# Patient Record
Sex: Female | Born: 1955 | Race: White | Hispanic: No | Marital: Married | Smoking: Never smoker
Health system: Southern US, Community
[De-identification: ages and names within clinical notes are randomized; demographics above are authoritative.]

## PROBLEM LIST (undated history)

## (undated) DIAGNOSIS — K219 Gastro-esophageal reflux disease without esophagitis: Secondary | ICD-10-CM

## (undated) DIAGNOSIS — J45909 Unspecified asthma, uncomplicated: Secondary | ICD-10-CM

## (undated) DIAGNOSIS — G2581 Restless legs syndrome: Secondary | ICD-10-CM

## (undated) DIAGNOSIS — E785 Hyperlipidemia, unspecified: Secondary | ICD-10-CM

## (undated) DIAGNOSIS — C419 Malignant neoplasm of bone and articular cartilage, unspecified: Secondary | ICD-10-CM

## (undated) DIAGNOSIS — I10 Essential (primary) hypertension: Secondary | ICD-10-CM

## (undated) DIAGNOSIS — F32A Depression, unspecified: Secondary | ICD-10-CM

## (undated) DIAGNOSIS — M1990 Unspecified osteoarthritis, unspecified site: Secondary | ICD-10-CM

## (undated) DIAGNOSIS — G629 Polyneuropathy, unspecified: Secondary | ICD-10-CM

## (undated) HISTORY — PX: ABDOMINAL HYSTERECTOMY: SHX81

## (undated) HISTORY — PX: OTHER SURGICAL HISTORY: SHX169

## (undated) HISTORY — PX: INGUINAL HERNIA REPAIR: SHX194

## (undated) HISTORY — PX: HERNIA REPAIR: SHX51

## (undated) HISTORY — PX: VAGINAL HYSTERECTOMY: SHX2639

## (undated) HISTORY — PX: TONSILLECTOMY: SUR1361

---

## 2002-11-18 HISTORY — PX: KNEE ARTHROSCOPY: SHX127

## 2014-12-08 DIAGNOSIS — N6489 Other specified disorders of breast: Secondary | ICD-10-CM | POA: Diagnosis not present

## 2014-12-08 DIAGNOSIS — Z1231 Encounter for screening mammogram for malignant neoplasm of breast: Secondary | ICD-10-CM | POA: Diagnosis not present

## 2014-12-13 DIAGNOSIS — R928 Other abnormal and inconclusive findings on diagnostic imaging of breast: Secondary | ICD-10-CM | POA: Diagnosis not present

## 2015-03-21 DIAGNOSIS — M169 Osteoarthritis of hip, unspecified: Secondary | ICD-10-CM | POA: Diagnosis not present

## 2015-03-21 DIAGNOSIS — E785 Hyperlipidemia, unspecified: Secondary | ICD-10-CM | POA: Diagnosis not present

## 2015-03-21 DIAGNOSIS — I10 Essential (primary) hypertension: Secondary | ICD-10-CM | POA: Diagnosis not present

## 2015-03-21 DIAGNOSIS — Z6836 Body mass index (BMI) 36.0-36.9, adult: Secondary | ICD-10-CM | POA: Diagnosis not present

## 2015-03-21 DIAGNOSIS — Z1389 Encounter for screening for other disorder: Secondary | ICD-10-CM | POA: Diagnosis not present

## 2015-03-21 DIAGNOSIS — G990 Autonomic neuropathy in diseases classified elsewhere: Secondary | ICD-10-CM | POA: Diagnosis not present

## 2015-03-21 DIAGNOSIS — Z79899 Other long term (current) drug therapy: Secondary | ICD-10-CM | POA: Diagnosis not present

## 2015-03-21 DIAGNOSIS — Z9181 History of falling: Secondary | ICD-10-CM | POA: Diagnosis not present

## 2015-09-26 DIAGNOSIS — Z6838 Body mass index (BMI) 38.0-38.9, adult: Secondary | ICD-10-CM | POA: Diagnosis not present

## 2015-09-26 DIAGNOSIS — G990 Autonomic neuropathy in diseases classified elsewhere: Secondary | ICD-10-CM | POA: Diagnosis not present

## 2015-09-26 DIAGNOSIS — Z9181 History of falling: Secondary | ICD-10-CM | POA: Diagnosis not present

## 2015-09-26 DIAGNOSIS — M169 Osteoarthritis of hip, unspecified: Secondary | ICD-10-CM | POA: Diagnosis not present

## 2015-09-26 DIAGNOSIS — I10 Essential (primary) hypertension: Secondary | ICD-10-CM | POA: Diagnosis not present

## 2015-09-26 DIAGNOSIS — E785 Hyperlipidemia, unspecified: Secondary | ICD-10-CM | POA: Diagnosis not present

## 2015-09-26 DIAGNOSIS — E663 Overweight: Secondary | ICD-10-CM | POA: Diagnosis not present

## 2015-09-26 DIAGNOSIS — Z1389 Encounter for screening for other disorder: Secondary | ICD-10-CM | POA: Diagnosis not present

## 2015-12-12 DIAGNOSIS — Z1231 Encounter for screening mammogram for malignant neoplasm of breast: Secondary | ICD-10-CM | POA: Diagnosis not present

## 2016-03-27 DIAGNOSIS — Z79899 Other long term (current) drug therapy: Secondary | ICD-10-CM | POA: Diagnosis not present

## 2016-03-27 DIAGNOSIS — E785 Hyperlipidemia, unspecified: Secondary | ICD-10-CM | POA: Diagnosis not present

## 2016-03-27 DIAGNOSIS — G990 Autonomic neuropathy in diseases classified elsewhere: Secondary | ICD-10-CM | POA: Diagnosis not present

## 2016-03-27 DIAGNOSIS — M169 Osteoarthritis of hip, unspecified: Secondary | ICD-10-CM | POA: Diagnosis not present

## 2016-03-27 DIAGNOSIS — Z6841 Body Mass Index (BMI) 40.0 and over, adult: Secondary | ICD-10-CM | POA: Diagnosis not present

## 2016-03-27 DIAGNOSIS — C419 Malignant neoplasm of bone and articular cartilage, unspecified: Secondary | ICD-10-CM | POA: Diagnosis not present

## 2016-03-27 DIAGNOSIS — I10 Essential (primary) hypertension: Secondary | ICD-10-CM | POA: Diagnosis not present

## 2016-09-26 DIAGNOSIS — M169 Osteoarthritis of hip, unspecified: Secondary | ICD-10-CM | POA: Diagnosis not present

## 2016-09-26 DIAGNOSIS — G990 Autonomic neuropathy in diseases classified elsewhere: Secondary | ICD-10-CM | POA: Diagnosis not present

## 2016-09-26 DIAGNOSIS — I10 Essential (primary) hypertension: Secondary | ICD-10-CM | POA: Diagnosis not present

## 2016-09-26 DIAGNOSIS — C419 Malignant neoplasm of bone and articular cartilage, unspecified: Secondary | ICD-10-CM | POA: Diagnosis not present

## 2016-09-26 DIAGNOSIS — E785 Hyperlipidemia, unspecified: Secondary | ICD-10-CM | POA: Diagnosis not present

## 2016-12-12 DIAGNOSIS — Z1231 Encounter for screening mammogram for malignant neoplasm of breast: Secondary | ICD-10-CM | POA: Diagnosis not present

## 2017-03-26 DIAGNOSIS — E785 Hyperlipidemia, unspecified: Secondary | ICD-10-CM | POA: Diagnosis not present

## 2017-03-26 DIAGNOSIS — G990 Autonomic neuropathy in diseases classified elsewhere: Secondary | ICD-10-CM | POA: Diagnosis not present

## 2017-03-26 DIAGNOSIS — Z6841 Body Mass Index (BMI) 40.0 and over, adult: Secondary | ICD-10-CM | POA: Diagnosis not present

## 2017-03-26 DIAGNOSIS — I10 Essential (primary) hypertension: Secondary | ICD-10-CM | POA: Diagnosis not present

## 2017-03-26 DIAGNOSIS — Z79899 Other long term (current) drug therapy: Secondary | ICD-10-CM | POA: Diagnosis not present

## 2017-03-26 DIAGNOSIS — Z1389 Encounter for screening for other disorder: Secondary | ICD-10-CM | POA: Diagnosis not present

## 2017-03-26 DIAGNOSIS — C419 Malignant neoplasm of bone and articular cartilage, unspecified: Secondary | ICD-10-CM | POA: Diagnosis not present

## 2017-03-26 DIAGNOSIS — M169 Osteoarthritis of hip, unspecified: Secondary | ICD-10-CM | POA: Diagnosis not present

## 2017-05-19 DIAGNOSIS — M25551 Pain in right hip: Secondary | ICD-10-CM | POA: Diagnosis not present

## 2017-05-19 DIAGNOSIS — Z85830 Personal history of malignant neoplasm of bone: Secondary | ICD-10-CM | POA: Diagnosis not present

## 2017-05-19 DIAGNOSIS — M5416 Radiculopathy, lumbar region: Secondary | ICD-10-CM | POA: Diagnosis not present

## 2017-05-23 DIAGNOSIS — M5116 Intervertebral disc disorders with radiculopathy, lumbar region: Secondary | ICD-10-CM | POA: Diagnosis not present

## 2017-05-23 DIAGNOSIS — Z85830 Personal history of malignant neoplasm of bone: Secondary | ICD-10-CM | POA: Diagnosis not present

## 2017-05-23 DIAGNOSIS — M48061 Spinal stenosis, lumbar region without neurogenic claudication: Secondary | ICD-10-CM | POA: Diagnosis not present

## 2017-05-23 DIAGNOSIS — M4316 Spondylolisthesis, lumbar region: Secondary | ICD-10-CM | POA: Diagnosis not present

## 2017-05-23 DIAGNOSIS — M5416 Radiculopathy, lumbar region: Secondary | ICD-10-CM | POA: Diagnosis not present

## 2017-05-23 DIAGNOSIS — M4726 Other spondylosis with radiculopathy, lumbar region: Secondary | ICD-10-CM | POA: Diagnosis not present

## 2017-05-29 DIAGNOSIS — M5416 Radiculopathy, lumbar region: Secondary | ICD-10-CM | POA: Diagnosis not present

## 2017-05-29 DIAGNOSIS — M48061 Spinal stenosis, lumbar region without neurogenic claudication: Secondary | ICD-10-CM | POA: Diagnosis not present

## 2017-05-29 DIAGNOSIS — M9983 Other biomechanical lesions of lumbar region: Secondary | ICD-10-CM | POA: Diagnosis not present

## 2017-05-29 DIAGNOSIS — M5136 Other intervertebral disc degeneration, lumbar region: Secondary | ICD-10-CM | POA: Diagnosis not present

## 2017-05-29 DIAGNOSIS — G894 Chronic pain syndrome: Secondary | ICD-10-CM | POA: Diagnosis not present

## 2017-06-23 DIAGNOSIS — M545 Low back pain: Secondary | ICD-10-CM | POA: Diagnosis not present

## 2017-06-23 DIAGNOSIS — M48062 Spinal stenosis, lumbar region with neurogenic claudication: Secondary | ICD-10-CM | POA: Diagnosis not present

## 2017-06-23 DIAGNOSIS — R262 Difficulty in walking, not elsewhere classified: Secondary | ICD-10-CM | POA: Diagnosis not present

## 2017-06-23 DIAGNOSIS — M4310 Spondylolisthesis, site unspecified: Secondary | ICD-10-CM | POA: Diagnosis not present

## 2017-07-23 DIAGNOSIS — M48062 Spinal stenosis, lumbar region with neurogenic claudication: Secondary | ICD-10-CM | POA: Diagnosis not present

## 2017-07-23 DIAGNOSIS — M545 Low back pain: Secondary | ICD-10-CM | POA: Diagnosis not present

## 2017-07-23 DIAGNOSIS — M5416 Radiculopathy, lumbar region: Secondary | ICD-10-CM | POA: Diagnosis not present

## 2017-08-06 DIAGNOSIS — M5416 Radiculopathy, lumbar region: Secondary | ICD-10-CM | POA: Diagnosis not present

## 2017-08-08 DIAGNOSIS — R269 Unspecified abnormalities of gait and mobility: Secondary | ICD-10-CM | POA: Diagnosis not present

## 2017-09-03 DIAGNOSIS — M5416 Radiculopathy, lumbar region: Secondary | ICD-10-CM | POA: Diagnosis not present

## 2017-09-18 DIAGNOSIS — M545 Low back pain: Secondary | ICD-10-CM | POA: Diagnosis not present

## 2017-09-18 DIAGNOSIS — M5416 Radiculopathy, lumbar region: Secondary | ICD-10-CM | POA: Diagnosis not present

## 2017-09-29 DIAGNOSIS — G990 Autonomic neuropathy in diseases classified elsewhere: Secondary | ICD-10-CM | POA: Diagnosis not present

## 2017-09-29 DIAGNOSIS — E785 Hyperlipidemia, unspecified: Secondary | ICD-10-CM | POA: Diagnosis not present

## 2017-09-29 DIAGNOSIS — Z6841 Body Mass Index (BMI) 40.0 and over, adult: Secondary | ICD-10-CM | POA: Diagnosis not present

## 2017-09-29 DIAGNOSIS — Z1331 Encounter for screening for depression: Secondary | ICD-10-CM | POA: Diagnosis not present

## 2017-09-29 DIAGNOSIS — C419 Malignant neoplasm of bone and articular cartilage, unspecified: Secondary | ICD-10-CM | POA: Diagnosis not present

## 2017-09-29 DIAGNOSIS — Z79899 Other long term (current) drug therapy: Secondary | ICD-10-CM | POA: Diagnosis not present

## 2017-09-29 DIAGNOSIS — M169 Osteoarthritis of hip, unspecified: Secondary | ICD-10-CM | POA: Diagnosis not present

## 2017-09-29 DIAGNOSIS — I10 Essential (primary) hypertension: Secondary | ICD-10-CM | POA: Diagnosis not present

## 2017-10-16 DIAGNOSIS — M545 Low back pain: Secondary | ICD-10-CM | POA: Diagnosis not present

## 2017-10-16 DIAGNOSIS — M48062 Spinal stenosis, lumbar region with neurogenic claudication: Secondary | ICD-10-CM | POA: Diagnosis not present

## 2017-10-16 DIAGNOSIS — M4310 Spondylolisthesis, site unspecified: Secondary | ICD-10-CM | POA: Diagnosis not present

## 2017-12-15 DIAGNOSIS — Z1231 Encounter for screening mammogram for malignant neoplasm of breast: Secondary | ICD-10-CM | POA: Diagnosis not present

## 2018-03-30 DIAGNOSIS — M1710 Unilateral primary osteoarthritis, unspecified knee: Secondary | ICD-10-CM | POA: Diagnosis not present

## 2018-03-30 DIAGNOSIS — Z6841 Body Mass Index (BMI) 40.0 and over, adult: Secondary | ICD-10-CM | POA: Diagnosis not present

## 2018-03-30 DIAGNOSIS — E785 Hyperlipidemia, unspecified: Secondary | ICD-10-CM | POA: Diagnosis not present

## 2018-03-30 DIAGNOSIS — J302 Other seasonal allergic rhinitis: Secondary | ICD-10-CM | POA: Diagnosis not present

## 2018-03-30 DIAGNOSIS — G990 Autonomic neuropathy in diseases classified elsewhere: Secondary | ICD-10-CM | POA: Diagnosis not present

## 2018-03-30 DIAGNOSIS — M169 Osteoarthritis of hip, unspecified: Secondary | ICD-10-CM | POA: Diagnosis not present

## 2018-03-30 DIAGNOSIS — L2089 Other atopic dermatitis: Secondary | ICD-10-CM | POA: Diagnosis not present

## 2018-03-30 DIAGNOSIS — I10 Essential (primary) hypertension: Secondary | ICD-10-CM | POA: Diagnosis not present

## 2018-03-30 DIAGNOSIS — C419 Malignant neoplasm of bone and articular cartilage, unspecified: Secondary | ICD-10-CM | POA: Diagnosis not present

## 2018-04-07 DIAGNOSIS — M1712 Unilateral primary osteoarthritis, left knee: Secondary | ICD-10-CM | POA: Diagnosis not present

## 2018-04-20 DIAGNOSIS — M159 Polyosteoarthritis, unspecified: Secondary | ICD-10-CM | POA: Diagnosis not present

## 2018-04-20 DIAGNOSIS — M160 Bilateral primary osteoarthritis of hip: Secondary | ICD-10-CM | POA: Diagnosis not present

## 2018-04-20 DIAGNOSIS — M545 Low back pain: Secondary | ICD-10-CM | POA: Diagnosis not present

## 2018-04-20 DIAGNOSIS — M170 Bilateral primary osteoarthritis of knee: Secondary | ICD-10-CM | POA: Diagnosis not present

## 2018-04-20 DIAGNOSIS — E669 Obesity, unspecified: Secondary | ICD-10-CM | POA: Diagnosis not present

## 2018-04-20 DIAGNOSIS — G894 Chronic pain syndrome: Secondary | ICD-10-CM | POA: Diagnosis not present

## 2018-04-29 DIAGNOSIS — H538 Other visual disturbances: Secondary | ICD-10-CM | POA: Diagnosis not present

## 2018-04-29 DIAGNOSIS — H04123 Dry eye syndrome of bilateral lacrimal glands: Secondary | ICD-10-CM | POA: Diagnosis not present

## 2018-04-29 DIAGNOSIS — H43391 Other vitreous opacities, right eye: Secondary | ICD-10-CM | POA: Diagnosis not present

## 2018-04-29 DIAGNOSIS — H47333 Pseudopapilledema of optic disc, bilateral: Secondary | ICD-10-CM | POA: Diagnosis not present

## 2018-04-29 DIAGNOSIS — H524 Presbyopia: Secondary | ICD-10-CM | POA: Diagnosis not present

## 2018-05-04 DIAGNOSIS — E669 Obesity, unspecified: Secondary | ICD-10-CM | POA: Diagnosis not present

## 2018-05-04 DIAGNOSIS — M545 Low back pain: Secondary | ICD-10-CM | POA: Diagnosis not present

## 2018-05-04 DIAGNOSIS — M170 Bilateral primary osteoarthritis of knee: Secondary | ICD-10-CM | POA: Diagnosis not present

## 2018-05-04 DIAGNOSIS — G894 Chronic pain syndrome: Secondary | ICD-10-CM | POA: Diagnosis not present

## 2018-05-04 DIAGNOSIS — M159 Polyosteoarthritis, unspecified: Secondary | ICD-10-CM | POA: Diagnosis not present

## 2018-05-04 DIAGNOSIS — M160 Bilateral primary osteoarthritis of hip: Secondary | ICD-10-CM | POA: Diagnosis not present

## 2018-05-13 DIAGNOSIS — G894 Chronic pain syndrome: Secondary | ICD-10-CM | POA: Diagnosis not present

## 2018-05-13 DIAGNOSIS — M545 Low back pain: Secondary | ICD-10-CM | POA: Diagnosis not present

## 2018-05-13 DIAGNOSIS — M160 Bilateral primary osteoarthritis of hip: Secondary | ICD-10-CM | POA: Diagnosis not present

## 2018-05-13 DIAGNOSIS — E669 Obesity, unspecified: Secondary | ICD-10-CM | POA: Diagnosis not present

## 2018-05-13 DIAGNOSIS — M159 Polyosteoarthritis, unspecified: Secondary | ICD-10-CM | POA: Diagnosis not present

## 2018-05-13 DIAGNOSIS — M170 Bilateral primary osteoarthritis of knee: Secondary | ICD-10-CM | POA: Diagnosis not present

## 2018-06-11 DIAGNOSIS — E669 Obesity, unspecified: Secondary | ICD-10-CM | POA: Diagnosis not present

## 2018-06-11 DIAGNOSIS — M170 Bilateral primary osteoarthritis of knee: Secondary | ICD-10-CM | POA: Diagnosis not present

## 2018-06-11 DIAGNOSIS — M47816 Spondylosis without myelopathy or radiculopathy, lumbar region: Secondary | ICD-10-CM | POA: Diagnosis not present

## 2018-06-11 DIAGNOSIS — M48061 Spinal stenosis, lumbar region without neurogenic claudication: Secondary | ICD-10-CM | POA: Diagnosis not present

## 2018-06-11 DIAGNOSIS — G894 Chronic pain syndrome: Secondary | ICD-10-CM | POA: Diagnosis not present

## 2018-06-11 DIAGNOSIS — M160 Bilateral primary osteoarthritis of hip: Secondary | ICD-10-CM | POA: Diagnosis not present

## 2018-06-11 DIAGNOSIS — M5136 Other intervertebral disc degeneration, lumbar region: Secondary | ICD-10-CM | POA: Diagnosis not present

## 2018-06-11 DIAGNOSIS — M545 Low back pain: Secondary | ICD-10-CM | POA: Diagnosis not present

## 2018-06-11 DIAGNOSIS — M159 Polyosteoarthritis, unspecified: Secondary | ICD-10-CM | POA: Diagnosis not present

## 2018-07-01 DIAGNOSIS — M160 Bilateral primary osteoarthritis of hip: Secondary | ICD-10-CM | POA: Diagnosis not present

## 2018-07-01 DIAGNOSIS — M170 Bilateral primary osteoarthritis of knee: Secondary | ICD-10-CM | POA: Diagnosis not present

## 2018-07-01 DIAGNOSIS — M545 Low back pain: Secondary | ICD-10-CM | POA: Diagnosis not present

## 2018-07-01 DIAGNOSIS — M48061 Spinal stenosis, lumbar region without neurogenic claudication: Secondary | ICD-10-CM | POA: Diagnosis not present

## 2018-07-01 DIAGNOSIS — M159 Polyosteoarthritis, unspecified: Secondary | ICD-10-CM | POA: Diagnosis not present

## 2018-07-01 DIAGNOSIS — M47816 Spondylosis without myelopathy or radiculopathy, lumbar region: Secondary | ICD-10-CM | POA: Diagnosis not present

## 2018-07-01 DIAGNOSIS — M5136 Other intervertebral disc degeneration, lumbar region: Secondary | ICD-10-CM | POA: Diagnosis not present

## 2018-07-01 DIAGNOSIS — E669 Obesity, unspecified: Secondary | ICD-10-CM | POA: Diagnosis not present

## 2018-07-01 DIAGNOSIS — G894 Chronic pain syndrome: Secondary | ICD-10-CM | POA: Diagnosis not present

## 2018-07-09 DIAGNOSIS — M1712 Unilateral primary osteoarthritis, left knee: Secondary | ICD-10-CM | POA: Diagnosis not present

## 2018-07-13 DIAGNOSIS — M5136 Other intervertebral disc degeneration, lumbar region: Secondary | ICD-10-CM | POA: Diagnosis not present

## 2018-07-13 DIAGNOSIS — M160 Bilateral primary osteoarthritis of hip: Secondary | ICD-10-CM | POA: Diagnosis not present

## 2018-07-13 DIAGNOSIS — M545 Low back pain: Secondary | ICD-10-CM | POA: Diagnosis not present

## 2018-07-13 DIAGNOSIS — M48061 Spinal stenosis, lumbar region without neurogenic claudication: Secondary | ICD-10-CM | POA: Diagnosis not present

## 2018-07-13 DIAGNOSIS — E669 Obesity, unspecified: Secondary | ICD-10-CM | POA: Diagnosis not present

## 2018-07-13 DIAGNOSIS — M159 Polyosteoarthritis, unspecified: Secondary | ICD-10-CM | POA: Diagnosis not present

## 2018-07-13 DIAGNOSIS — M47816 Spondylosis without myelopathy or radiculopathy, lumbar region: Secondary | ICD-10-CM | POA: Diagnosis not present

## 2018-07-13 DIAGNOSIS — G894 Chronic pain syndrome: Secondary | ICD-10-CM | POA: Diagnosis not present

## 2018-07-13 DIAGNOSIS — M170 Bilateral primary osteoarthritis of knee: Secondary | ICD-10-CM | POA: Diagnosis not present

## 2018-08-05 DIAGNOSIS — M1712 Unilateral primary osteoarthritis, left knee: Secondary | ICD-10-CM | POA: Diagnosis not present

## 2018-08-05 DIAGNOSIS — M25562 Pain in left knee: Secondary | ICD-10-CM | POA: Diagnosis not present

## 2018-08-05 DIAGNOSIS — M1711 Unilateral primary osteoarthritis, right knee: Secondary | ICD-10-CM | POA: Diagnosis not present

## 2018-08-17 DIAGNOSIS — M160 Bilateral primary osteoarthritis of hip: Secondary | ICD-10-CM | POA: Diagnosis not present

## 2018-08-17 DIAGNOSIS — M170 Bilateral primary osteoarthritis of knee: Secondary | ICD-10-CM | POA: Diagnosis not present

## 2018-08-17 DIAGNOSIS — M47816 Spondylosis without myelopathy or radiculopathy, lumbar region: Secondary | ICD-10-CM | POA: Diagnosis not present

## 2018-08-17 DIAGNOSIS — E669 Obesity, unspecified: Secondary | ICD-10-CM | POA: Diagnosis not present

## 2018-08-17 DIAGNOSIS — M159 Polyosteoarthritis, unspecified: Secondary | ICD-10-CM | POA: Diagnosis not present

## 2018-08-17 DIAGNOSIS — M48061 Spinal stenosis, lumbar region without neurogenic claudication: Secondary | ICD-10-CM | POA: Diagnosis not present

## 2018-08-17 DIAGNOSIS — G894 Chronic pain syndrome: Secondary | ICD-10-CM | POA: Diagnosis not present

## 2018-08-17 DIAGNOSIS — M5136 Other intervertebral disc degeneration, lumbar region: Secondary | ICD-10-CM | POA: Diagnosis not present

## 2018-09-07 DIAGNOSIS — R05 Cough: Secondary | ICD-10-CM | POA: Diagnosis not present

## 2018-09-07 DIAGNOSIS — Z6838 Body mass index (BMI) 38.0-38.9, adult: Secondary | ICD-10-CM | POA: Diagnosis not present

## 2018-09-07 DIAGNOSIS — J0100 Acute maxillary sinusitis, unspecified: Secondary | ICD-10-CM | POA: Diagnosis not present

## 2018-09-07 DIAGNOSIS — Z79899 Other long term (current) drug therapy: Secondary | ICD-10-CM | POA: Diagnosis not present

## 2018-09-07 DIAGNOSIS — J302 Other seasonal allergic rhinitis: Secondary | ICD-10-CM | POA: Diagnosis not present

## 2018-09-16 DIAGNOSIS — M159 Polyosteoarthritis, unspecified: Secondary | ICD-10-CM | POA: Diagnosis not present

## 2018-09-16 DIAGNOSIS — M47816 Spondylosis without myelopathy or radiculopathy, lumbar region: Secondary | ICD-10-CM | POA: Diagnosis not present

## 2018-09-16 DIAGNOSIS — M160 Bilateral primary osteoarthritis of hip: Secondary | ICD-10-CM | POA: Diagnosis not present

## 2018-09-16 DIAGNOSIS — M48061 Spinal stenosis, lumbar region without neurogenic claudication: Secondary | ICD-10-CM | POA: Diagnosis not present

## 2018-09-16 DIAGNOSIS — G894 Chronic pain syndrome: Secondary | ICD-10-CM | POA: Diagnosis not present

## 2018-09-16 DIAGNOSIS — M5136 Other intervertebral disc degeneration, lumbar region: Secondary | ICD-10-CM | POA: Diagnosis not present

## 2018-09-16 DIAGNOSIS — E669 Obesity, unspecified: Secondary | ICD-10-CM | POA: Diagnosis not present

## 2018-09-16 DIAGNOSIS — M170 Bilateral primary osteoarthritis of knee: Secondary | ICD-10-CM | POA: Diagnosis not present

## 2018-09-30 DIAGNOSIS — I10 Essential (primary) hypertension: Secondary | ICD-10-CM | POA: Diagnosis not present

## 2018-09-30 DIAGNOSIS — J302 Other seasonal allergic rhinitis: Secondary | ICD-10-CM | POA: Diagnosis not present

## 2018-09-30 DIAGNOSIS — M1710 Unilateral primary osteoarthritis, unspecified knee: Secondary | ICD-10-CM | POA: Diagnosis not present

## 2018-09-30 DIAGNOSIS — G990 Autonomic neuropathy in diseases classified elsewhere: Secondary | ICD-10-CM | POA: Diagnosis not present

## 2018-09-30 DIAGNOSIS — E785 Hyperlipidemia, unspecified: Secondary | ICD-10-CM | POA: Diagnosis not present

## 2018-09-30 DIAGNOSIS — F329 Major depressive disorder, single episode, unspecified: Secondary | ICD-10-CM | POA: Diagnosis not present

## 2018-09-30 DIAGNOSIS — M169 Osteoarthritis of hip, unspecified: Secondary | ICD-10-CM | POA: Diagnosis not present

## 2018-09-30 DIAGNOSIS — C419 Malignant neoplasm of bone and articular cartilage, unspecified: Secondary | ICD-10-CM | POA: Diagnosis not present

## 2018-09-30 DIAGNOSIS — B369 Superficial mycosis, unspecified: Secondary | ICD-10-CM | POA: Diagnosis not present

## 2018-10-19 DIAGNOSIS — M5136 Other intervertebral disc degeneration, lumbar region: Secondary | ICD-10-CM | POA: Diagnosis not present

## 2018-10-19 DIAGNOSIS — M47816 Spondylosis without myelopathy or radiculopathy, lumbar region: Secondary | ICD-10-CM | POA: Diagnosis not present

## 2018-10-19 DIAGNOSIS — M160 Bilateral primary osteoarthritis of hip: Secondary | ICD-10-CM | POA: Diagnosis not present

## 2018-10-19 DIAGNOSIS — M170 Bilateral primary osteoarthritis of knee: Secondary | ICD-10-CM | POA: Diagnosis not present

## 2018-10-19 DIAGNOSIS — M48061 Spinal stenosis, lumbar region without neurogenic claudication: Secondary | ICD-10-CM | POA: Diagnosis not present

## 2018-10-19 DIAGNOSIS — M159 Polyosteoarthritis, unspecified: Secondary | ICD-10-CM | POA: Diagnosis not present

## 2018-10-19 DIAGNOSIS — E669 Obesity, unspecified: Secondary | ICD-10-CM | POA: Diagnosis not present

## 2018-10-19 DIAGNOSIS — G894 Chronic pain syndrome: Secondary | ICD-10-CM | POA: Diagnosis not present

## 2018-10-29 DIAGNOSIS — Z79899 Other long term (current) drug therapy: Secondary | ICD-10-CM | POA: Diagnosis not present

## 2018-10-29 DIAGNOSIS — Z6841 Body Mass Index (BMI) 40.0 and over, adult: Secondary | ICD-10-CM | POA: Diagnosis not present

## 2018-10-29 DIAGNOSIS — F329 Major depressive disorder, single episode, unspecified: Secondary | ICD-10-CM | POA: Diagnosis not present

## 2018-10-29 DIAGNOSIS — Z1331 Encounter for screening for depression: Secondary | ICD-10-CM | POA: Diagnosis not present

## 2018-12-16 DIAGNOSIS — Z1231 Encounter for screening mammogram for malignant neoplasm of breast: Secondary | ICD-10-CM | POA: Diagnosis not present

## 2018-12-17 DIAGNOSIS — G894 Chronic pain syndrome: Secondary | ICD-10-CM | POA: Diagnosis not present

## 2018-12-17 DIAGNOSIS — M5136 Other intervertebral disc degeneration, lumbar region: Secondary | ICD-10-CM | POA: Diagnosis not present

## 2018-12-17 DIAGNOSIS — M48061 Spinal stenosis, lumbar region without neurogenic claudication: Secondary | ICD-10-CM | POA: Diagnosis not present

## 2018-12-17 DIAGNOSIS — M170 Bilateral primary osteoarthritis of knee: Secondary | ICD-10-CM | POA: Diagnosis not present

## 2018-12-17 DIAGNOSIS — M47816 Spondylosis without myelopathy or radiculopathy, lumbar region: Secondary | ICD-10-CM | POA: Diagnosis not present

## 2018-12-17 DIAGNOSIS — M159 Polyosteoarthritis, unspecified: Secondary | ICD-10-CM | POA: Diagnosis not present

## 2018-12-17 DIAGNOSIS — E669 Obesity, unspecified: Secondary | ICD-10-CM | POA: Diagnosis not present

## 2018-12-17 DIAGNOSIS — M160 Bilateral primary osteoarthritis of hip: Secondary | ICD-10-CM | POA: Diagnosis not present

## 2019-01-15 DIAGNOSIS — M48061 Spinal stenosis, lumbar region without neurogenic claudication: Secondary | ICD-10-CM | POA: Diagnosis not present

## 2019-01-15 DIAGNOSIS — C419 Malignant neoplasm of bone and articular cartilage, unspecified: Secondary | ICD-10-CM | POA: Diagnosis not present

## 2019-01-15 DIAGNOSIS — M47816 Spondylosis without myelopathy or radiculopathy, lumbar region: Secondary | ICD-10-CM | POA: Diagnosis not present

## 2019-01-15 DIAGNOSIS — G894 Chronic pain syndrome: Secondary | ICD-10-CM | POA: Diagnosis not present

## 2019-01-15 DIAGNOSIS — G629 Polyneuropathy, unspecified: Secondary | ICD-10-CM | POA: Diagnosis not present

## 2019-01-15 DIAGNOSIS — M5136 Other intervertebral disc degeneration, lumbar region: Secondary | ICD-10-CM | POA: Diagnosis not present

## 2019-01-15 DIAGNOSIS — M170 Bilateral primary osteoarthritis of knee: Secondary | ICD-10-CM | POA: Diagnosis not present

## 2019-01-15 DIAGNOSIS — M159 Polyosteoarthritis, unspecified: Secondary | ICD-10-CM | POA: Diagnosis not present

## 2019-01-15 DIAGNOSIS — E669 Obesity, unspecified: Secondary | ICD-10-CM | POA: Diagnosis not present

## 2019-02-10 DIAGNOSIS — M5136 Other intervertebral disc degeneration, lumbar region: Secondary | ICD-10-CM | POA: Diagnosis not present

## 2019-02-10 DIAGNOSIS — M170 Bilateral primary osteoarthritis of knee: Secondary | ICD-10-CM | POA: Diagnosis not present

## 2019-02-10 DIAGNOSIS — M48061 Spinal stenosis, lumbar region without neurogenic claudication: Secondary | ICD-10-CM | POA: Diagnosis not present

## 2019-02-10 DIAGNOSIS — E669 Obesity, unspecified: Secondary | ICD-10-CM | POA: Diagnosis not present

## 2019-02-10 DIAGNOSIS — M159 Polyosteoarthritis, unspecified: Secondary | ICD-10-CM | POA: Diagnosis not present

## 2019-02-10 DIAGNOSIS — G629 Polyneuropathy, unspecified: Secondary | ICD-10-CM | POA: Diagnosis not present

## 2019-02-10 DIAGNOSIS — C419 Malignant neoplasm of bone and articular cartilage, unspecified: Secondary | ICD-10-CM | POA: Diagnosis not present

## 2019-02-10 DIAGNOSIS — M47816 Spondylosis without myelopathy or radiculopathy, lumbar region: Secondary | ICD-10-CM | POA: Diagnosis not present

## 2019-02-10 DIAGNOSIS — G894 Chronic pain syndrome: Secondary | ICD-10-CM | POA: Diagnosis not present

## 2019-03-10 DIAGNOSIS — M48061 Spinal stenosis, lumbar region without neurogenic claudication: Secondary | ICD-10-CM | POA: Diagnosis not present

## 2019-03-10 DIAGNOSIS — G894 Chronic pain syndrome: Secondary | ICD-10-CM | POA: Diagnosis not present

## 2019-03-10 DIAGNOSIS — M5136 Other intervertebral disc degeneration, lumbar region: Secondary | ICD-10-CM | POA: Diagnosis not present

## 2019-03-10 DIAGNOSIS — M170 Bilateral primary osteoarthritis of knee: Secondary | ICD-10-CM | POA: Diagnosis not present

## 2019-03-10 DIAGNOSIS — E669 Obesity, unspecified: Secondary | ICD-10-CM | POA: Diagnosis not present

## 2019-03-10 DIAGNOSIS — C419 Malignant neoplasm of bone and articular cartilage, unspecified: Secondary | ICD-10-CM | POA: Diagnosis not present

## 2019-03-10 DIAGNOSIS — M47816 Spondylosis without myelopathy or radiculopathy, lumbar region: Secondary | ICD-10-CM | POA: Diagnosis not present

## 2019-03-10 DIAGNOSIS — M159 Polyosteoarthritis, unspecified: Secondary | ICD-10-CM | POA: Diagnosis not present

## 2019-03-10 DIAGNOSIS — G629 Polyneuropathy, unspecified: Secondary | ICD-10-CM | POA: Diagnosis not present

## 2019-04-14 DIAGNOSIS — F329 Major depressive disorder, single episode, unspecified: Secondary | ICD-10-CM | POA: Diagnosis not present

## 2019-04-14 DIAGNOSIS — G990 Autonomic neuropathy in diseases classified elsewhere: Secondary | ICD-10-CM | POA: Diagnosis not present

## 2019-04-14 DIAGNOSIS — J45909 Unspecified asthma, uncomplicated: Secondary | ICD-10-CM | POA: Diagnosis not present

## 2019-04-14 DIAGNOSIS — M1710 Unilateral primary osteoarthritis, unspecified knee: Secondary | ICD-10-CM | POA: Diagnosis not present

## 2019-04-14 DIAGNOSIS — C419 Malignant neoplasm of bone and articular cartilage, unspecified: Secondary | ICD-10-CM | POA: Diagnosis not present

## 2019-04-14 DIAGNOSIS — Z6841 Body Mass Index (BMI) 40.0 and over, adult: Secondary | ICD-10-CM | POA: Diagnosis not present

## 2019-04-14 DIAGNOSIS — E785 Hyperlipidemia, unspecified: Secondary | ICD-10-CM | POA: Diagnosis not present

## 2019-04-14 DIAGNOSIS — I10 Essential (primary) hypertension: Secondary | ICD-10-CM | POA: Diagnosis not present

## 2019-04-14 DIAGNOSIS — M169 Osteoarthritis of hip, unspecified: Secondary | ICD-10-CM | POA: Diagnosis not present

## 2019-04-15 DIAGNOSIS — M170 Bilateral primary osteoarthritis of knee: Secondary | ICD-10-CM | POA: Diagnosis not present

## 2019-04-15 DIAGNOSIS — M47816 Spondylosis without myelopathy or radiculopathy, lumbar region: Secondary | ICD-10-CM | POA: Diagnosis not present

## 2019-04-15 DIAGNOSIS — G894 Chronic pain syndrome: Secondary | ICD-10-CM | POA: Diagnosis not present

## 2019-04-15 DIAGNOSIS — G629 Polyneuropathy, unspecified: Secondary | ICD-10-CM | POA: Diagnosis not present

## 2019-04-15 DIAGNOSIS — M159 Polyosteoarthritis, unspecified: Secondary | ICD-10-CM | POA: Diagnosis not present

## 2019-04-15 DIAGNOSIS — M48061 Spinal stenosis, lumbar region without neurogenic claudication: Secondary | ICD-10-CM | POA: Diagnosis not present

## 2019-04-15 DIAGNOSIS — C419 Malignant neoplasm of bone and articular cartilage, unspecified: Secondary | ICD-10-CM | POA: Diagnosis not present

## 2019-04-15 DIAGNOSIS — E669 Obesity, unspecified: Secondary | ICD-10-CM | POA: Diagnosis not present

## 2019-04-15 DIAGNOSIS — M5136 Other intervertebral disc degeneration, lumbar region: Secondary | ICD-10-CM | POA: Diagnosis not present

## 2019-05-06 DIAGNOSIS — H524 Presbyopia: Secondary | ICD-10-CM | POA: Diagnosis not present

## 2019-05-06 DIAGNOSIS — H47333 Pseudopapilledema of optic disc, bilateral: Secondary | ICD-10-CM | POA: Diagnosis not present

## 2019-05-06 DIAGNOSIS — H43391 Other vitreous opacities, right eye: Secondary | ICD-10-CM | POA: Diagnosis not present

## 2019-05-06 DIAGNOSIS — H04123 Dry eye syndrome of bilateral lacrimal glands: Secondary | ICD-10-CM | POA: Diagnosis not present

## 2019-05-13 DIAGNOSIS — M5136 Other intervertebral disc degeneration, lumbar region: Secondary | ICD-10-CM | POA: Diagnosis not present

## 2019-05-13 DIAGNOSIS — M48061 Spinal stenosis, lumbar region without neurogenic claudication: Secondary | ICD-10-CM | POA: Diagnosis not present

## 2019-05-13 DIAGNOSIS — M170 Bilateral primary osteoarthritis of knee: Secondary | ICD-10-CM | POA: Diagnosis not present

## 2019-05-13 DIAGNOSIS — C419 Malignant neoplasm of bone and articular cartilage, unspecified: Secondary | ICD-10-CM | POA: Diagnosis not present

## 2019-05-13 DIAGNOSIS — G894 Chronic pain syndrome: Secondary | ICD-10-CM | POA: Diagnosis not present

## 2019-05-13 DIAGNOSIS — G629 Polyneuropathy, unspecified: Secondary | ICD-10-CM | POA: Diagnosis not present

## 2019-05-13 DIAGNOSIS — E669 Obesity, unspecified: Secondary | ICD-10-CM | POA: Diagnosis not present

## 2019-05-13 DIAGNOSIS — M47816 Spondylosis without myelopathy or radiculopathy, lumbar region: Secondary | ICD-10-CM | POA: Diagnosis not present

## 2019-05-13 DIAGNOSIS — M159 Polyosteoarthritis, unspecified: Secondary | ICD-10-CM | POA: Diagnosis not present

## 2019-06-16 DIAGNOSIS — Z1389 Encounter for screening for other disorder: Secondary | ICD-10-CM | POA: Diagnosis not present

## 2019-06-16 DIAGNOSIS — M5136 Other intervertebral disc degeneration, lumbar region: Secondary | ICD-10-CM | POA: Diagnosis not present

## 2019-06-16 DIAGNOSIS — M159 Polyosteoarthritis, unspecified: Secondary | ICD-10-CM | POA: Diagnosis not present

## 2019-06-16 DIAGNOSIS — M170 Bilateral primary osteoarthritis of knee: Secondary | ICD-10-CM | POA: Diagnosis not present

## 2019-06-16 DIAGNOSIS — M47816 Spondylosis without myelopathy or radiculopathy, lumbar region: Secondary | ICD-10-CM | POA: Diagnosis not present

## 2019-06-16 DIAGNOSIS — G629 Polyneuropathy, unspecified: Secondary | ICD-10-CM | POA: Diagnosis not present

## 2019-06-16 DIAGNOSIS — G894 Chronic pain syndrome: Secondary | ICD-10-CM | POA: Diagnosis not present

## 2019-06-16 DIAGNOSIS — M48061 Spinal stenosis, lumbar region without neurogenic claudication: Secondary | ICD-10-CM | POA: Diagnosis not present

## 2019-07-15 DIAGNOSIS — M170 Bilateral primary osteoarthritis of knee: Secondary | ICD-10-CM | POA: Diagnosis not present

## 2019-07-15 DIAGNOSIS — G894 Chronic pain syndrome: Secondary | ICD-10-CM | POA: Diagnosis not present

## 2019-07-15 DIAGNOSIS — M5136 Other intervertebral disc degeneration, lumbar region: Secondary | ICD-10-CM | POA: Diagnosis not present

## 2019-07-15 DIAGNOSIS — M47816 Spondylosis without myelopathy or radiculopathy, lumbar region: Secondary | ICD-10-CM | POA: Diagnosis not present

## 2019-07-15 DIAGNOSIS — G629 Polyneuropathy, unspecified: Secondary | ICD-10-CM | POA: Diagnosis not present

## 2019-07-15 DIAGNOSIS — M159 Polyosteoarthritis, unspecified: Secondary | ICD-10-CM | POA: Diagnosis not present

## 2019-07-15 DIAGNOSIS — M48061 Spinal stenosis, lumbar region without neurogenic claudication: Secondary | ICD-10-CM | POA: Diagnosis not present

## 2019-07-15 DIAGNOSIS — Z1389 Encounter for screening for other disorder: Secondary | ICD-10-CM | POA: Diagnosis not present

## 2019-08-03 DIAGNOSIS — G990 Autonomic neuropathy in diseases classified elsewhere: Secondary | ICD-10-CM | POA: Diagnosis not present

## 2019-08-03 DIAGNOSIS — E785 Hyperlipidemia, unspecified: Secondary | ICD-10-CM | POA: Diagnosis not present

## 2019-08-03 DIAGNOSIS — I10 Essential (primary) hypertension: Secondary | ICD-10-CM | POA: Diagnosis not present

## 2019-08-03 DIAGNOSIS — F329 Major depressive disorder, single episode, unspecified: Secondary | ICD-10-CM | POA: Diagnosis not present

## 2019-08-03 DIAGNOSIS — C419 Malignant neoplasm of bone and articular cartilage, unspecified: Secondary | ICD-10-CM | POA: Diagnosis not present

## 2019-08-03 DIAGNOSIS — Z79899 Other long term (current) drug therapy: Secondary | ICD-10-CM | POA: Diagnosis not present

## 2019-08-03 DIAGNOSIS — M169 Osteoarthritis of hip, unspecified: Secondary | ICD-10-CM | POA: Diagnosis not present

## 2019-08-03 DIAGNOSIS — B369 Superficial mycosis, unspecified: Secondary | ICD-10-CM | POA: Diagnosis not present

## 2019-08-03 DIAGNOSIS — Z6841 Body Mass Index (BMI) 40.0 and over, adult: Secondary | ICD-10-CM | POA: Diagnosis not present

## 2019-08-03 DIAGNOSIS — L659 Nonscarring hair loss, unspecified: Secondary | ICD-10-CM | POA: Diagnosis not present

## 2019-08-16 DIAGNOSIS — Z1389 Encounter for screening for other disorder: Secondary | ICD-10-CM | POA: Diagnosis not present

## 2019-08-16 DIAGNOSIS — G894 Chronic pain syndrome: Secondary | ICD-10-CM | POA: Diagnosis not present

## 2019-08-16 DIAGNOSIS — Z79891 Long term (current) use of opiate analgesic: Secondary | ICD-10-CM | POA: Diagnosis not present

## 2019-08-16 DIAGNOSIS — M47816 Spondylosis without myelopathy or radiculopathy, lumbar region: Secondary | ICD-10-CM | POA: Diagnosis not present

## 2019-08-16 DIAGNOSIS — M5136 Other intervertebral disc degeneration, lumbar region: Secondary | ICD-10-CM | POA: Diagnosis not present

## 2019-08-16 DIAGNOSIS — G629 Polyneuropathy, unspecified: Secondary | ICD-10-CM | POA: Diagnosis not present

## 2019-08-16 DIAGNOSIS — M170 Bilateral primary osteoarthritis of knee: Secondary | ICD-10-CM | POA: Diagnosis not present

## 2019-08-16 DIAGNOSIS — M159 Polyosteoarthritis, unspecified: Secondary | ICD-10-CM | POA: Diagnosis not present

## 2019-08-16 DIAGNOSIS — M48061 Spinal stenosis, lumbar region without neurogenic claudication: Secondary | ICD-10-CM | POA: Diagnosis not present

## 2019-08-20 DIAGNOSIS — Z6838 Body mass index (BMI) 38.0-38.9, adult: Secondary | ICD-10-CM | POA: Diagnosis not present

## 2019-08-20 DIAGNOSIS — Z9181 History of falling: Secondary | ICD-10-CM | POA: Diagnosis not present

## 2019-08-20 DIAGNOSIS — Z1331 Encounter for screening for depression: Secondary | ICD-10-CM | POA: Diagnosis not present

## 2019-08-20 DIAGNOSIS — E785 Hyperlipidemia, unspecified: Secondary | ICD-10-CM | POA: Diagnosis not present

## 2019-08-20 DIAGNOSIS — Z0000 Encounter for general adult medical examination without abnormal findings: Secondary | ICD-10-CM | POA: Diagnosis not present

## 2019-09-13 DIAGNOSIS — G629 Polyneuropathy, unspecified: Secondary | ICD-10-CM | POA: Diagnosis not present

## 2019-09-13 DIAGNOSIS — M47816 Spondylosis without myelopathy or radiculopathy, lumbar region: Secondary | ICD-10-CM | POA: Diagnosis not present

## 2019-09-13 DIAGNOSIS — M159 Polyosteoarthritis, unspecified: Secondary | ICD-10-CM | POA: Diagnosis not present

## 2019-09-13 DIAGNOSIS — M48061 Spinal stenosis, lumbar region without neurogenic claudication: Secondary | ICD-10-CM | POA: Diagnosis not present

## 2019-09-13 DIAGNOSIS — M5136 Other intervertebral disc degeneration, lumbar region: Secondary | ICD-10-CM | POA: Diagnosis not present

## 2019-09-13 DIAGNOSIS — G894 Chronic pain syndrome: Secondary | ICD-10-CM | POA: Diagnosis not present

## 2019-09-13 DIAGNOSIS — Z79891 Long term (current) use of opiate analgesic: Secondary | ICD-10-CM | POA: Diagnosis not present

## 2019-09-13 DIAGNOSIS — Z1389 Encounter for screening for other disorder: Secondary | ICD-10-CM | POA: Diagnosis not present

## 2019-10-11 DIAGNOSIS — G629 Polyneuropathy, unspecified: Secondary | ICD-10-CM | POA: Diagnosis not present

## 2019-10-11 DIAGNOSIS — M48061 Spinal stenosis, lumbar region without neurogenic claudication: Secondary | ICD-10-CM | POA: Diagnosis not present

## 2019-10-11 DIAGNOSIS — Z1389 Encounter for screening for other disorder: Secondary | ICD-10-CM | POA: Diagnosis not present

## 2019-10-11 DIAGNOSIS — M5136 Other intervertebral disc degeneration, lumbar region: Secondary | ICD-10-CM | POA: Diagnosis not present

## 2019-10-11 DIAGNOSIS — M159 Polyosteoarthritis, unspecified: Secondary | ICD-10-CM | POA: Diagnosis not present

## 2019-10-11 DIAGNOSIS — Z79891 Long term (current) use of opiate analgesic: Secondary | ICD-10-CM | POA: Diagnosis not present

## 2019-10-11 DIAGNOSIS — M47816 Spondylosis without myelopathy or radiculopathy, lumbar region: Secondary | ICD-10-CM | POA: Diagnosis not present

## 2019-10-11 DIAGNOSIS — G894 Chronic pain syndrome: Secondary | ICD-10-CM | POA: Diagnosis not present

## 2019-11-08 DIAGNOSIS — G894 Chronic pain syndrome: Secondary | ICD-10-CM | POA: Diagnosis not present

## 2019-11-08 DIAGNOSIS — M159 Polyosteoarthritis, unspecified: Secondary | ICD-10-CM | POA: Diagnosis not present

## 2019-11-08 DIAGNOSIS — M47816 Spondylosis without myelopathy or radiculopathy, lumbar region: Secondary | ICD-10-CM | POA: Diagnosis not present

## 2019-11-08 DIAGNOSIS — G629 Polyneuropathy, unspecified: Secondary | ICD-10-CM | POA: Diagnosis not present

## 2019-11-08 DIAGNOSIS — M5136 Other intervertebral disc degeneration, lumbar region: Secondary | ICD-10-CM | POA: Diagnosis not present

## 2019-11-08 DIAGNOSIS — Z79891 Long term (current) use of opiate analgesic: Secondary | ICD-10-CM | POA: Diagnosis not present

## 2019-11-08 DIAGNOSIS — M48061 Spinal stenosis, lumbar region without neurogenic claudication: Secondary | ICD-10-CM | POA: Diagnosis not present

## 2019-11-08 DIAGNOSIS — M170 Bilateral primary osteoarthritis of knee: Secondary | ICD-10-CM | POA: Diagnosis not present

## 2019-11-09 DIAGNOSIS — G894 Chronic pain syndrome: Secondary | ICD-10-CM | POA: Diagnosis not present

## 2019-11-09 DIAGNOSIS — L03119 Cellulitis of unspecified part of limb: Secondary | ICD-10-CM | POA: Diagnosis not present

## 2019-11-09 DIAGNOSIS — I872 Venous insufficiency (chronic) (peripheral): Secondary | ICD-10-CM | POA: Diagnosis not present

## 2019-11-09 DIAGNOSIS — Z79899 Other long term (current) drug therapy: Secondary | ICD-10-CM | POA: Diagnosis not present

## 2019-11-09 DIAGNOSIS — R600 Localized edema: Secondary | ICD-10-CM | POA: Diagnosis not present

## 2019-11-09 DIAGNOSIS — G990 Autonomic neuropathy in diseases classified elsewhere: Secondary | ICD-10-CM | POA: Diagnosis not present

## 2019-11-09 DIAGNOSIS — Z6841 Body Mass Index (BMI) 40.0 and over, adult: Secondary | ICD-10-CM | POA: Diagnosis not present

## 2019-11-29 DIAGNOSIS — M25561 Pain in right knee: Secondary | ICD-10-CM | POA: Diagnosis not present

## 2019-11-29 DIAGNOSIS — G894 Chronic pain syndrome: Secondary | ICD-10-CM | POA: Diagnosis not present

## 2019-11-29 DIAGNOSIS — M5136 Other intervertebral disc degeneration, lumbar region: Secondary | ICD-10-CM | POA: Diagnosis not present

## 2019-11-29 DIAGNOSIS — M545 Low back pain: Secondary | ICD-10-CM | POA: Diagnosis not present

## 2019-11-29 DIAGNOSIS — Z79899 Other long term (current) drug therapy: Secondary | ICD-10-CM | POA: Diagnosis not present

## 2019-12-06 DIAGNOSIS — J45909 Unspecified asthma, uncomplicated: Secondary | ICD-10-CM | POA: Diagnosis not present

## 2019-12-06 DIAGNOSIS — C419 Malignant neoplasm of bone and articular cartilage, unspecified: Secondary | ICD-10-CM | POA: Diagnosis not present

## 2019-12-06 DIAGNOSIS — F329 Major depressive disorder, single episode, unspecified: Secondary | ICD-10-CM | POA: Diagnosis not present

## 2019-12-06 DIAGNOSIS — L659 Nonscarring hair loss, unspecified: Secondary | ICD-10-CM | POA: Diagnosis not present

## 2019-12-06 DIAGNOSIS — Z79899 Other long term (current) drug therapy: Secondary | ICD-10-CM | POA: Diagnosis not present

## 2019-12-06 DIAGNOSIS — I10 Essential (primary) hypertension: Secondary | ICD-10-CM | POA: Diagnosis not present

## 2019-12-06 DIAGNOSIS — E785 Hyperlipidemia, unspecified: Secondary | ICD-10-CM | POA: Diagnosis not present

## 2019-12-06 DIAGNOSIS — M169 Osteoarthritis of hip, unspecified: Secondary | ICD-10-CM | POA: Diagnosis not present

## 2019-12-06 DIAGNOSIS — G990 Autonomic neuropathy in diseases classified elsewhere: Secondary | ICD-10-CM | POA: Diagnosis not present

## 2019-12-08 DIAGNOSIS — M25562 Pain in left knee: Secondary | ICD-10-CM | POA: Diagnosis not present

## 2019-12-08 DIAGNOSIS — M25561 Pain in right knee: Secondary | ICD-10-CM | POA: Diagnosis not present

## 2019-12-08 DIAGNOSIS — M545 Low back pain: Secondary | ICD-10-CM | POA: Diagnosis not present

## 2019-12-08 DIAGNOSIS — G8929 Other chronic pain: Secondary | ICD-10-CM | POA: Diagnosis not present

## 2020-01-07 DIAGNOSIS — G8929 Other chronic pain: Secondary | ICD-10-CM | POA: Diagnosis not present

## 2020-01-07 DIAGNOSIS — Z79899 Other long term (current) drug therapy: Secondary | ICD-10-CM | POA: Diagnosis not present

## 2020-01-07 DIAGNOSIS — M25512 Pain in left shoulder: Secondary | ICD-10-CM | POA: Diagnosis not present

## 2020-01-07 DIAGNOSIS — M5136 Other intervertebral disc degeneration, lumbar region: Secondary | ICD-10-CM | POA: Diagnosis not present

## 2020-01-07 DIAGNOSIS — M25562 Pain in left knee: Secondary | ICD-10-CM | POA: Diagnosis not present

## 2020-01-07 DIAGNOSIS — M25561 Pain in right knee: Secondary | ICD-10-CM | POA: Diagnosis not present

## 2020-01-25 DIAGNOSIS — Z1231 Encounter for screening mammogram for malignant neoplasm of breast: Secondary | ICD-10-CM | POA: Diagnosis not present

## 2020-02-04 DIAGNOSIS — G8929 Other chronic pain: Secondary | ICD-10-CM | POA: Diagnosis not present

## 2020-02-04 DIAGNOSIS — M5136 Other intervertebral disc degeneration, lumbar region: Secondary | ICD-10-CM | POA: Diagnosis not present

## 2020-02-04 DIAGNOSIS — M25562 Pain in left knee: Secondary | ICD-10-CM | POA: Diagnosis not present

## 2020-02-04 DIAGNOSIS — Z79899 Other long term (current) drug therapy: Secondary | ICD-10-CM | POA: Diagnosis not present

## 2020-02-04 DIAGNOSIS — M25561 Pain in right knee: Secondary | ICD-10-CM | POA: Diagnosis not present

## 2020-03-02 DIAGNOSIS — Z79899 Other long term (current) drug therapy: Secondary | ICD-10-CM | POA: Diagnosis not present

## 2020-03-02 DIAGNOSIS — R200 Anesthesia of skin: Secondary | ICD-10-CM | POA: Diagnosis not present

## 2020-03-02 DIAGNOSIS — M5136 Other intervertebral disc degeneration, lumbar region: Secondary | ICD-10-CM | POA: Diagnosis not present

## 2020-03-02 DIAGNOSIS — M25562 Pain in left knee: Secondary | ICD-10-CM | POA: Diagnosis not present

## 2020-03-02 DIAGNOSIS — M25561 Pain in right knee: Secondary | ICD-10-CM | POA: Diagnosis not present

## 2020-03-30 DIAGNOSIS — G8929 Other chronic pain: Secondary | ICD-10-CM | POA: Diagnosis not present

## 2020-03-30 DIAGNOSIS — N183 Chronic kidney disease, stage 3 unspecified: Secondary | ICD-10-CM | POA: Diagnosis not present

## 2020-03-30 DIAGNOSIS — Z79899 Other long term (current) drug therapy: Secondary | ICD-10-CM | POA: Diagnosis not present

## 2020-03-30 DIAGNOSIS — M5136 Other intervertebral disc degeneration, lumbar region: Secondary | ICD-10-CM | POA: Diagnosis not present

## 2020-03-30 DIAGNOSIS — M25562 Pain in left knee: Secondary | ICD-10-CM | POA: Diagnosis not present

## 2020-03-30 DIAGNOSIS — M25561 Pain in right knee: Secondary | ICD-10-CM | POA: Diagnosis not present

## 2020-04-03 DIAGNOSIS — B369 Superficial mycosis, unspecified: Secondary | ICD-10-CM | POA: Diagnosis not present

## 2020-04-03 DIAGNOSIS — M169 Osteoarthritis of hip, unspecified: Secondary | ICD-10-CM | POA: Diagnosis not present

## 2020-04-03 DIAGNOSIS — C419 Malignant neoplasm of bone and articular cartilage, unspecified: Secondary | ICD-10-CM | POA: Diagnosis not present

## 2020-04-03 DIAGNOSIS — Z79899 Other long term (current) drug therapy: Secondary | ICD-10-CM | POA: Diagnosis not present

## 2020-04-03 DIAGNOSIS — E785 Hyperlipidemia, unspecified: Secondary | ICD-10-CM | POA: Diagnosis not present

## 2020-04-03 DIAGNOSIS — L659 Nonscarring hair loss, unspecified: Secondary | ICD-10-CM | POA: Diagnosis not present

## 2020-04-03 DIAGNOSIS — I10 Essential (primary) hypertension: Secondary | ICD-10-CM | POA: Diagnosis not present

## 2020-04-03 DIAGNOSIS — F329 Major depressive disorder, single episode, unspecified: Secondary | ICD-10-CM | POA: Diagnosis not present

## 2020-04-03 DIAGNOSIS — G990 Autonomic neuropathy in diseases classified elsewhere: Secondary | ICD-10-CM | POA: Diagnosis not present

## 2020-04-26 DIAGNOSIS — M5136 Other intervertebral disc degeneration, lumbar region: Secondary | ICD-10-CM | POA: Diagnosis not present

## 2020-04-26 DIAGNOSIS — M25562 Pain in left knee: Secondary | ICD-10-CM | POA: Diagnosis not present

## 2020-04-26 DIAGNOSIS — M25561 Pain in right knee: Secondary | ICD-10-CM | POA: Diagnosis not present

## 2020-04-26 DIAGNOSIS — G8929 Other chronic pain: Secondary | ICD-10-CM | POA: Diagnosis not present

## 2020-04-26 DIAGNOSIS — Z79899 Other long term (current) drug therapy: Secondary | ICD-10-CM | POA: Diagnosis not present

## 2020-05-24 DIAGNOSIS — M25561 Pain in right knee: Secondary | ICD-10-CM | POA: Diagnosis not present

## 2020-05-24 DIAGNOSIS — M5136 Other intervertebral disc degeneration, lumbar region: Secondary | ICD-10-CM | POA: Diagnosis not present

## 2020-05-24 DIAGNOSIS — G8929 Other chronic pain: Secondary | ICD-10-CM | POA: Diagnosis not present

## 2020-05-24 DIAGNOSIS — M25562 Pain in left knee: Secondary | ICD-10-CM | POA: Diagnosis not present

## 2020-05-24 DIAGNOSIS — Z79899 Other long term (current) drug therapy: Secondary | ICD-10-CM | POA: Diagnosis not present

## 2020-06-22 DIAGNOSIS — M5136 Other intervertebral disc degeneration, lumbar region: Secondary | ICD-10-CM | POA: Diagnosis not present

## 2020-06-22 DIAGNOSIS — Z79899 Other long term (current) drug therapy: Secondary | ICD-10-CM | POA: Diagnosis not present

## 2020-06-22 DIAGNOSIS — M5416 Radiculopathy, lumbar region: Secondary | ICD-10-CM | POA: Diagnosis not present

## 2020-06-22 DIAGNOSIS — M25561 Pain in right knee: Secondary | ICD-10-CM | POA: Diagnosis not present

## 2020-06-29 DIAGNOSIS — M25561 Pain in right knee: Secondary | ICD-10-CM | POA: Diagnosis not present

## 2020-06-29 DIAGNOSIS — M545 Low back pain: Secondary | ICD-10-CM | POA: Diagnosis not present

## 2020-06-29 DIAGNOSIS — M25562 Pain in left knee: Secondary | ICD-10-CM | POA: Diagnosis not present

## 2020-07-07 DIAGNOSIS — M25561 Pain in right knee: Secondary | ICD-10-CM | POA: Diagnosis not present

## 2020-07-07 DIAGNOSIS — M25562 Pain in left knee: Secondary | ICD-10-CM | POA: Diagnosis not present

## 2020-07-07 DIAGNOSIS — M545 Low back pain: Secondary | ICD-10-CM | POA: Diagnosis not present

## 2020-07-10 DIAGNOSIS — M25561 Pain in right knee: Secondary | ICD-10-CM | POA: Diagnosis not present

## 2020-07-10 DIAGNOSIS — M25562 Pain in left knee: Secondary | ICD-10-CM | POA: Diagnosis not present

## 2020-07-10 DIAGNOSIS — M545 Low back pain: Secondary | ICD-10-CM | POA: Diagnosis not present

## 2020-07-14 DIAGNOSIS — M25561 Pain in right knee: Secondary | ICD-10-CM | POA: Diagnosis not present

## 2020-07-14 DIAGNOSIS — M545 Low back pain: Secondary | ICD-10-CM | POA: Diagnosis not present

## 2020-07-14 DIAGNOSIS — M25562 Pain in left knee: Secondary | ICD-10-CM | POA: Diagnosis not present

## 2020-07-17 DIAGNOSIS — M545 Low back pain: Secondary | ICD-10-CM | POA: Diagnosis not present

## 2020-07-17 DIAGNOSIS — M25562 Pain in left knee: Secondary | ICD-10-CM | POA: Diagnosis not present

## 2020-07-17 DIAGNOSIS — M25561 Pain in right knee: Secondary | ICD-10-CM | POA: Diagnosis not present

## 2020-07-20 DIAGNOSIS — Z79899 Other long term (current) drug therapy: Secondary | ICD-10-CM | POA: Diagnosis not present

## 2020-07-20 DIAGNOSIS — M5416 Radiculopathy, lumbar region: Secondary | ICD-10-CM | POA: Diagnosis not present

## 2020-07-20 DIAGNOSIS — W450XXA Nail entering through skin, initial encounter: Secondary | ICD-10-CM | POA: Diagnosis not present

## 2020-07-20 DIAGNOSIS — Z23 Encounter for immunization: Secondary | ICD-10-CM | POA: Diagnosis not present

## 2020-07-20 DIAGNOSIS — S91339A Puncture wound without foreign body, unspecified foot, initial encounter: Secondary | ICD-10-CM | POA: Diagnosis not present

## 2020-07-20 DIAGNOSIS — M25561 Pain in right knee: Secondary | ICD-10-CM | POA: Diagnosis not present

## 2020-07-20 DIAGNOSIS — M5136 Other intervertebral disc degeneration, lumbar region: Secondary | ICD-10-CM | POA: Diagnosis not present

## 2020-07-24 DIAGNOSIS — M545 Low back pain: Secondary | ICD-10-CM | POA: Diagnosis not present

## 2020-07-24 DIAGNOSIS — M25562 Pain in left knee: Secondary | ICD-10-CM | POA: Diagnosis not present

## 2020-07-24 DIAGNOSIS — M25561 Pain in right knee: Secondary | ICD-10-CM | POA: Diagnosis not present

## 2020-08-07 DIAGNOSIS — Z6841 Body Mass Index (BMI) 40.0 and over, adult: Secondary | ICD-10-CM | POA: Diagnosis not present

## 2020-08-07 DIAGNOSIS — F329 Major depressive disorder, single episode, unspecified: Secondary | ICD-10-CM | POA: Diagnosis not present

## 2020-08-07 DIAGNOSIS — J45909 Unspecified asthma, uncomplicated: Secondary | ICD-10-CM | POA: Diagnosis not present

## 2020-08-07 DIAGNOSIS — C419 Malignant neoplasm of bone and articular cartilage, unspecified: Secondary | ICD-10-CM | POA: Diagnosis not present

## 2020-08-07 DIAGNOSIS — I10 Essential (primary) hypertension: Secondary | ICD-10-CM | POA: Diagnosis not present

## 2020-08-07 DIAGNOSIS — E785 Hyperlipidemia, unspecified: Secondary | ICD-10-CM | POA: Diagnosis not present

## 2020-08-07 DIAGNOSIS — G990 Autonomic neuropathy in diseases classified elsewhere: Secondary | ICD-10-CM | POA: Diagnosis not present

## 2020-08-07 DIAGNOSIS — M169 Osteoarthritis of hip, unspecified: Secondary | ICD-10-CM | POA: Diagnosis not present

## 2020-08-07 DIAGNOSIS — Z79899 Other long term (current) drug therapy: Secondary | ICD-10-CM | POA: Diagnosis not present

## 2020-08-11 DIAGNOSIS — H04123 Dry eye syndrome of bilateral lacrimal glands: Secondary | ICD-10-CM | POA: Diagnosis not present

## 2020-08-11 DIAGNOSIS — H524 Presbyopia: Secondary | ICD-10-CM | POA: Diagnosis not present

## 2020-08-11 DIAGNOSIS — H43391 Other vitreous opacities, right eye: Secondary | ICD-10-CM | POA: Diagnosis not present

## 2020-08-11 DIAGNOSIS — H47333 Pseudopapilledema of optic disc, bilateral: Secondary | ICD-10-CM | POA: Diagnosis not present

## 2020-08-19 DIAGNOSIS — M25561 Pain in right knee: Secondary | ICD-10-CM | POA: Diagnosis not present

## 2020-08-19 DIAGNOSIS — Z79899 Other long term (current) drug therapy: Secondary | ICD-10-CM | POA: Diagnosis not present

## 2020-08-19 DIAGNOSIS — M5136 Other intervertebral disc degeneration, lumbar region: Secondary | ICD-10-CM | POA: Diagnosis not present

## 2020-08-19 DIAGNOSIS — M5416 Radiculopathy, lumbar region: Secondary | ICD-10-CM | POA: Diagnosis not present

## 2020-09-14 DIAGNOSIS — Z79899 Other long term (current) drug therapy: Secondary | ICD-10-CM | POA: Diagnosis not present

## 2020-09-14 DIAGNOSIS — M25561 Pain in right knee: Secondary | ICD-10-CM | POA: Diagnosis not present

## 2020-09-14 DIAGNOSIS — M5136 Other intervertebral disc degeneration, lumbar region: Secondary | ICD-10-CM | POA: Diagnosis not present

## 2020-09-14 DIAGNOSIS — M5416 Radiculopathy, lumbar region: Secondary | ICD-10-CM | POA: Diagnosis not present

## 2020-10-18 DIAGNOSIS — M25561 Pain in right knee: Secondary | ICD-10-CM | POA: Diagnosis not present

## 2020-10-18 DIAGNOSIS — Z79899 Other long term (current) drug therapy: Secondary | ICD-10-CM | POA: Diagnosis not present

## 2020-10-18 DIAGNOSIS — M5416 Radiculopathy, lumbar region: Secondary | ICD-10-CM | POA: Diagnosis not present

## 2020-10-18 DIAGNOSIS — M5136 Other intervertebral disc degeneration, lumbar region: Secondary | ICD-10-CM | POA: Diagnosis not present

## 2020-11-22 DIAGNOSIS — M5136 Other intervertebral disc degeneration, lumbar region: Secondary | ICD-10-CM | POA: Diagnosis not present

## 2020-11-22 DIAGNOSIS — Z79899 Other long term (current) drug therapy: Secondary | ICD-10-CM | POA: Diagnosis not present

## 2020-11-22 DIAGNOSIS — M5416 Radiculopathy, lumbar region: Secondary | ICD-10-CM | POA: Diagnosis not present

## 2020-11-22 DIAGNOSIS — N183 Chronic kidney disease, stage 3 unspecified: Secondary | ICD-10-CM | POA: Diagnosis not present

## 2020-11-22 DIAGNOSIS — M25561 Pain in right knee: Secondary | ICD-10-CM | POA: Diagnosis not present

## 2020-11-29 DIAGNOSIS — E669 Obesity, unspecified: Secondary | ICD-10-CM | POA: Diagnosis not present

## 2020-11-29 DIAGNOSIS — Z0000 Encounter for general adult medical examination without abnormal findings: Secondary | ICD-10-CM | POA: Diagnosis not present

## 2020-11-29 DIAGNOSIS — Z9181 History of falling: Secondary | ICD-10-CM | POA: Diagnosis not present

## 2020-11-29 DIAGNOSIS — E785 Hyperlipidemia, unspecified: Secondary | ICD-10-CM | POA: Diagnosis not present

## 2020-11-29 DIAGNOSIS — Z1331 Encounter for screening for depression: Secondary | ICD-10-CM | POA: Diagnosis not present

## 2020-12-07 DIAGNOSIS — E785 Hyperlipidemia, unspecified: Secondary | ICD-10-CM | POA: Diagnosis not present

## 2020-12-07 DIAGNOSIS — J45909 Unspecified asthma, uncomplicated: Secondary | ICD-10-CM | POA: Diagnosis not present

## 2020-12-07 DIAGNOSIS — C419 Malignant neoplasm of bone and articular cartilage, unspecified: Secondary | ICD-10-CM | POA: Diagnosis not present

## 2020-12-07 DIAGNOSIS — F329 Major depressive disorder, single episode, unspecified: Secondary | ICD-10-CM | POA: Diagnosis not present

## 2020-12-07 DIAGNOSIS — Z6841 Body Mass Index (BMI) 40.0 and over, adult: Secondary | ICD-10-CM | POA: Diagnosis not present

## 2020-12-07 DIAGNOSIS — M169 Osteoarthritis of hip, unspecified: Secondary | ICD-10-CM | POA: Diagnosis not present

## 2020-12-07 DIAGNOSIS — Z79899 Other long term (current) drug therapy: Secondary | ICD-10-CM | POA: Diagnosis not present

## 2020-12-07 DIAGNOSIS — G990 Autonomic neuropathy in diseases classified elsewhere: Secondary | ICD-10-CM | POA: Diagnosis not present

## 2020-12-07 DIAGNOSIS — I10 Essential (primary) hypertension: Secondary | ICD-10-CM | POA: Diagnosis not present

## 2020-12-20 DIAGNOSIS — M5136 Other intervertebral disc degeneration, lumbar region: Secondary | ICD-10-CM | POA: Diagnosis not present

## 2020-12-20 DIAGNOSIS — M5416 Radiculopathy, lumbar region: Secondary | ICD-10-CM | POA: Diagnosis not present

## 2020-12-20 DIAGNOSIS — M25561 Pain in right knee: Secondary | ICD-10-CM | POA: Diagnosis not present

## 2020-12-20 DIAGNOSIS — Z79899 Other long term (current) drug therapy: Secondary | ICD-10-CM | POA: Diagnosis not present

## 2021-01-17 DIAGNOSIS — M5416 Radiculopathy, lumbar region: Secondary | ICD-10-CM | POA: Diagnosis not present

## 2021-01-17 DIAGNOSIS — M25561 Pain in right knee: Secondary | ICD-10-CM | POA: Diagnosis not present

## 2021-01-17 DIAGNOSIS — M5136 Other intervertebral disc degeneration, lumbar region: Secondary | ICD-10-CM | POA: Diagnosis not present

## 2021-01-17 DIAGNOSIS — M25562 Pain in left knee: Secondary | ICD-10-CM | POA: Diagnosis not present

## 2021-02-14 DIAGNOSIS — M5416 Radiculopathy, lumbar region: Secondary | ICD-10-CM | POA: Diagnosis not present

## 2021-02-14 DIAGNOSIS — M5136 Other intervertebral disc degeneration, lumbar region: Secondary | ICD-10-CM | POA: Diagnosis not present

## 2021-02-14 DIAGNOSIS — M25561 Pain in right knee: Secondary | ICD-10-CM | POA: Diagnosis not present

## 2021-02-14 DIAGNOSIS — M25562 Pain in left knee: Secondary | ICD-10-CM | POA: Diagnosis not present

## 2021-03-14 DIAGNOSIS — M5416 Radiculopathy, lumbar region: Secondary | ICD-10-CM | POA: Diagnosis not present

## 2021-03-14 DIAGNOSIS — M5136 Other intervertebral disc degeneration, lumbar region: Secondary | ICD-10-CM | POA: Diagnosis not present

## 2021-03-14 DIAGNOSIS — R0981 Nasal congestion: Secondary | ICD-10-CM | POA: Diagnosis not present

## 2021-03-14 DIAGNOSIS — J018 Other acute sinusitis: Secondary | ICD-10-CM | POA: Diagnosis not present

## 2021-04-05 DIAGNOSIS — C419 Malignant neoplasm of bone and articular cartilage, unspecified: Secondary | ICD-10-CM | POA: Diagnosis not present

## 2021-04-05 DIAGNOSIS — Z6841 Body Mass Index (BMI) 40.0 and over, adult: Secondary | ICD-10-CM | POA: Diagnosis not present

## 2021-04-05 DIAGNOSIS — I10 Essential (primary) hypertension: Secondary | ICD-10-CM | POA: Diagnosis not present

## 2021-04-05 DIAGNOSIS — Z79899 Other long term (current) drug therapy: Secondary | ICD-10-CM | POA: Diagnosis not present

## 2021-04-05 DIAGNOSIS — G990 Autonomic neuropathy in diseases classified elsewhere: Secondary | ICD-10-CM | POA: Diagnosis not present

## 2021-04-05 DIAGNOSIS — J45909 Unspecified asthma, uncomplicated: Secondary | ICD-10-CM | POA: Diagnosis not present

## 2021-04-05 DIAGNOSIS — E785 Hyperlipidemia, unspecified: Secondary | ICD-10-CM | POA: Diagnosis not present

## 2021-04-05 DIAGNOSIS — M169 Osteoarthritis of hip, unspecified: Secondary | ICD-10-CM | POA: Diagnosis not present

## 2021-04-05 DIAGNOSIS — F329 Major depressive disorder, single episode, unspecified: Secondary | ICD-10-CM | POA: Diagnosis not present

## 2021-04-11 DIAGNOSIS — M25561 Pain in right knee: Secondary | ICD-10-CM | POA: Diagnosis not present

## 2021-04-11 DIAGNOSIS — M79671 Pain in right foot: Secondary | ICD-10-CM | POA: Diagnosis not present

## 2021-04-11 DIAGNOSIS — M25562 Pain in left knee: Secondary | ICD-10-CM | POA: Diagnosis not present

## 2021-04-11 DIAGNOSIS — M5136 Other intervertebral disc degeneration, lumbar region: Secondary | ICD-10-CM | POA: Diagnosis not present

## 2021-04-11 DIAGNOSIS — M79672 Pain in left foot: Secondary | ICD-10-CM | POA: Diagnosis not present

## 2021-04-11 DIAGNOSIS — M5416 Radiculopathy, lumbar region: Secondary | ICD-10-CM | POA: Diagnosis not present

## 2021-04-11 DIAGNOSIS — G8929 Other chronic pain: Secondary | ICD-10-CM | POA: Diagnosis not present

## 2021-05-09 DIAGNOSIS — M25561 Pain in right knee: Secondary | ICD-10-CM | POA: Diagnosis not present

## 2021-05-09 DIAGNOSIS — M25562 Pain in left knee: Secondary | ICD-10-CM | POA: Diagnosis not present

## 2021-05-09 DIAGNOSIS — M5136 Other intervertebral disc degeneration, lumbar region: Secondary | ICD-10-CM | POA: Diagnosis not present

## 2021-05-09 DIAGNOSIS — M5416 Radiculopathy, lumbar region: Secondary | ICD-10-CM | POA: Diagnosis not present

## 2021-05-17 DIAGNOSIS — Q66222 Congenital metatarsus adductus, left foot: Secondary | ICD-10-CM | POA: Diagnosis not present

## 2021-05-17 DIAGNOSIS — Q66221 Congenital metatarsus adductus, right foot: Secondary | ICD-10-CM | POA: Diagnosis not present

## 2021-06-11 DIAGNOSIS — M5416 Radiculopathy, lumbar region: Secondary | ICD-10-CM | POA: Diagnosis not present

## 2021-06-11 DIAGNOSIS — I10 Essential (primary) hypertension: Secondary | ICD-10-CM | POA: Diagnosis not present

## 2021-06-11 DIAGNOSIS — Z79899 Other long term (current) drug therapy: Secondary | ICD-10-CM | POA: Diagnosis not present

## 2021-06-11 DIAGNOSIS — G8929 Other chronic pain: Secondary | ICD-10-CM | POA: Diagnosis not present

## 2021-06-11 DIAGNOSIS — M79672 Pain in left foot: Secondary | ICD-10-CM | POA: Diagnosis not present

## 2021-06-11 DIAGNOSIS — Z6841 Body Mass Index (BMI) 40.0 and over, adult: Secondary | ICD-10-CM | POA: Diagnosis not present

## 2021-06-11 DIAGNOSIS — M79671 Pain in right foot: Secondary | ICD-10-CM | POA: Diagnosis not present

## 2021-07-12 DIAGNOSIS — Z79899 Other long term (current) drug therapy: Secondary | ICD-10-CM | POA: Diagnosis not present

## 2021-07-12 DIAGNOSIS — I10 Essential (primary) hypertension: Secondary | ICD-10-CM | POA: Diagnosis not present

## 2021-07-12 DIAGNOSIS — G8929 Other chronic pain: Secondary | ICD-10-CM | POA: Diagnosis not present

## 2021-07-12 DIAGNOSIS — M5416 Radiculopathy, lumbar region: Secondary | ICD-10-CM | POA: Diagnosis not present

## 2021-07-12 DIAGNOSIS — Z6841 Body Mass Index (BMI) 40.0 and over, adult: Secondary | ICD-10-CM | POA: Diagnosis not present

## 2021-07-12 DIAGNOSIS — M79671 Pain in right foot: Secondary | ICD-10-CM | POA: Diagnosis not present

## 2021-07-12 DIAGNOSIS — M79672 Pain in left foot: Secondary | ICD-10-CM | POA: Diagnosis not present

## 2021-08-07 DIAGNOSIS — C419 Malignant neoplasm of bone and articular cartilage, unspecified: Secondary | ICD-10-CM | POA: Diagnosis not present

## 2021-08-07 DIAGNOSIS — J45909 Unspecified asthma, uncomplicated: Secondary | ICD-10-CM | POA: Diagnosis not present

## 2021-08-07 DIAGNOSIS — I10 Essential (primary) hypertension: Secondary | ICD-10-CM | POA: Diagnosis not present

## 2021-08-07 DIAGNOSIS — Z79899 Other long term (current) drug therapy: Secondary | ICD-10-CM | POA: Diagnosis not present

## 2021-08-07 DIAGNOSIS — G990 Autonomic neuropathy in diseases classified elsewhere: Secondary | ICD-10-CM | POA: Diagnosis not present

## 2021-08-07 DIAGNOSIS — M169 Osteoarthritis of hip, unspecified: Secondary | ICD-10-CM | POA: Diagnosis not present

## 2021-08-07 DIAGNOSIS — F329 Major depressive disorder, single episode, unspecified: Secondary | ICD-10-CM | POA: Diagnosis not present

## 2021-08-07 DIAGNOSIS — Z6841 Body Mass Index (BMI) 40.0 and over, adult: Secondary | ICD-10-CM | POA: Diagnosis not present

## 2021-08-07 DIAGNOSIS — E785 Hyperlipidemia, unspecified: Secondary | ICD-10-CM | POA: Diagnosis not present

## 2021-08-15 DIAGNOSIS — I10 Essential (primary) hypertension: Secondary | ICD-10-CM | POA: Diagnosis not present

## 2021-08-15 DIAGNOSIS — G8929 Other chronic pain: Secondary | ICD-10-CM | POA: Diagnosis not present

## 2021-08-15 DIAGNOSIS — M79672 Pain in left foot: Secondary | ICD-10-CM | POA: Diagnosis not present

## 2021-08-15 DIAGNOSIS — Z6841 Body Mass Index (BMI) 40.0 and over, adult: Secondary | ICD-10-CM | POA: Diagnosis not present

## 2021-08-15 DIAGNOSIS — M5416 Radiculopathy, lumbar region: Secondary | ICD-10-CM | POA: Diagnosis not present

## 2021-08-15 DIAGNOSIS — M79671 Pain in right foot: Secondary | ICD-10-CM | POA: Diagnosis not present

## 2021-08-16 DIAGNOSIS — H43391 Other vitreous opacities, right eye: Secondary | ICD-10-CM | POA: Diagnosis not present

## 2021-08-16 DIAGNOSIS — H47333 Pseudopapilledema of optic disc, bilateral: Secondary | ICD-10-CM | POA: Diagnosis not present

## 2021-08-16 DIAGNOSIS — H04123 Dry eye syndrome of bilateral lacrimal glands: Secondary | ICD-10-CM | POA: Diagnosis not present

## 2021-08-16 DIAGNOSIS — H524 Presbyopia: Secondary | ICD-10-CM | POA: Diagnosis not present

## 2021-09-13 DIAGNOSIS — Z79899 Other long term (current) drug therapy: Secondary | ICD-10-CM | POA: Diagnosis not present

## 2021-09-13 DIAGNOSIS — I10 Essential (primary) hypertension: Secondary | ICD-10-CM | POA: Diagnosis not present

## 2021-09-13 DIAGNOSIS — R5383 Other fatigue: Secondary | ICD-10-CM | POA: Diagnosis not present

## 2021-09-13 DIAGNOSIS — M5416 Radiculopathy, lumbar region: Secondary | ICD-10-CM | POA: Diagnosis not present

## 2021-09-13 DIAGNOSIS — M79672 Pain in left foot: Secondary | ICD-10-CM | POA: Diagnosis not present

## 2021-09-13 DIAGNOSIS — Z6841 Body Mass Index (BMI) 40.0 and over, adult: Secondary | ICD-10-CM | POA: Diagnosis not present

## 2021-09-13 DIAGNOSIS — G8929 Other chronic pain: Secondary | ICD-10-CM | POA: Diagnosis not present

## 2021-09-13 DIAGNOSIS — M79671 Pain in right foot: Secondary | ICD-10-CM | POA: Diagnosis not present

## 2021-10-15 DIAGNOSIS — M1711 Unilateral primary osteoarthritis, right knee: Secondary | ICD-10-CM | POA: Diagnosis not present

## 2021-10-15 DIAGNOSIS — M25561 Pain in right knee: Secondary | ICD-10-CM | POA: Diagnosis not present

## 2021-10-15 DIAGNOSIS — G8929 Other chronic pain: Secondary | ICD-10-CM | POA: Diagnosis not present

## 2021-10-15 DIAGNOSIS — I959 Hypotension, unspecified: Secondary | ICD-10-CM | POA: Diagnosis not present

## 2021-10-15 DIAGNOSIS — M21161 Varus deformity, not elsewhere classified, right knee: Secondary | ICD-10-CM | POA: Diagnosis not present

## 2021-10-15 DIAGNOSIS — M79661 Pain in right lower leg: Secondary | ICD-10-CM | POA: Diagnosis not present

## 2021-10-17 DIAGNOSIS — Z79899 Other long term (current) drug therapy: Secondary | ICD-10-CM | POA: Diagnosis not present

## 2021-10-17 DIAGNOSIS — M25562 Pain in left knee: Secondary | ICD-10-CM | POA: Diagnosis not present

## 2021-10-17 DIAGNOSIS — M5136 Other intervertebral disc degeneration, lumbar region: Secondary | ICD-10-CM | POA: Diagnosis not present

## 2021-10-17 DIAGNOSIS — M79604 Pain in right leg: Secondary | ICD-10-CM | POA: Diagnosis not present

## 2021-10-17 DIAGNOSIS — M25561 Pain in right knee: Secondary | ICD-10-CM | POA: Diagnosis not present

## 2021-10-17 DIAGNOSIS — Z131 Encounter for screening for diabetes mellitus: Secondary | ICD-10-CM | POA: Diagnosis not present

## 2021-10-17 DIAGNOSIS — R202 Paresthesia of skin: Secondary | ICD-10-CM | POA: Diagnosis not present

## 2021-10-24 DIAGNOSIS — M25561 Pain in right knee: Secondary | ICD-10-CM | POA: Diagnosis not present

## 2021-10-24 DIAGNOSIS — M5136 Other intervertebral disc degeneration, lumbar region: Secondary | ICD-10-CM | POA: Diagnosis not present

## 2021-10-24 DIAGNOSIS — G8929 Other chronic pain: Secondary | ICD-10-CM | POA: Diagnosis not present

## 2021-10-24 DIAGNOSIS — M25562 Pain in left knee: Secondary | ICD-10-CM | POA: Diagnosis not present

## 2021-10-29 DIAGNOSIS — Z79899 Other long term (current) drug therapy: Secondary | ICD-10-CM | POA: Diagnosis not present

## 2021-10-29 DIAGNOSIS — M179 Osteoarthritis of knee, unspecified: Secondary | ICD-10-CM | POA: Diagnosis not present

## 2021-10-30 DIAGNOSIS — M25561 Pain in right knee: Secondary | ICD-10-CM | POA: Diagnosis not present

## 2021-11-09 DIAGNOSIS — M25562 Pain in left knee: Secondary | ICD-10-CM | POA: Diagnosis not present

## 2021-11-09 DIAGNOSIS — G8929 Other chronic pain: Secondary | ICD-10-CM | POA: Diagnosis not present

## 2021-11-09 DIAGNOSIS — M5136 Other intervertebral disc degeneration, lumbar region: Secondary | ICD-10-CM | POA: Diagnosis not present

## 2021-11-09 DIAGNOSIS — M25561 Pain in right knee: Secondary | ICD-10-CM | POA: Diagnosis not present

## 2021-11-13 DIAGNOSIS — M25562 Pain in left knee: Secondary | ICD-10-CM | POA: Diagnosis not present

## 2021-11-13 DIAGNOSIS — S82101A Unspecified fracture of upper end of right tibia, initial encounter for closed fracture: Secondary | ICD-10-CM | POA: Diagnosis not present

## 2021-11-13 DIAGNOSIS — M25561 Pain in right knee: Secondary | ICD-10-CM | POA: Diagnosis not present

## 2021-11-13 DIAGNOSIS — M170 Bilateral primary osteoarthritis of knee: Secondary | ICD-10-CM | POA: Diagnosis not present

## 2021-12-04 DIAGNOSIS — Z0000 Encounter for general adult medical examination without abnormal findings: Secondary | ICD-10-CM | POA: Diagnosis not present

## 2021-12-04 DIAGNOSIS — E669 Obesity, unspecified: Secondary | ICD-10-CM | POA: Diagnosis not present

## 2021-12-04 DIAGNOSIS — E785 Hyperlipidemia, unspecified: Secondary | ICD-10-CM | POA: Diagnosis not present

## 2021-12-04 DIAGNOSIS — Z6841 Body Mass Index (BMI) 40.0 and over, adult: Secondary | ICD-10-CM | POA: Diagnosis not present

## 2021-12-04 DIAGNOSIS — Z1331 Encounter for screening for depression: Secondary | ICD-10-CM | POA: Diagnosis not present

## 2021-12-04 DIAGNOSIS — Z9181 History of falling: Secondary | ICD-10-CM | POA: Diagnosis not present

## 2021-12-05 DIAGNOSIS — G990 Autonomic neuropathy in diseases classified elsewhere: Secondary | ICD-10-CM | POA: Diagnosis not present

## 2021-12-05 DIAGNOSIS — F329 Major depressive disorder, single episode, unspecified: Secondary | ICD-10-CM | POA: Diagnosis not present

## 2021-12-05 DIAGNOSIS — Z6841 Body Mass Index (BMI) 40.0 and over, adult: Secondary | ICD-10-CM | POA: Diagnosis not present

## 2021-12-05 DIAGNOSIS — J45909 Unspecified asthma, uncomplicated: Secondary | ICD-10-CM | POA: Diagnosis not present

## 2021-12-05 DIAGNOSIS — I10 Essential (primary) hypertension: Secondary | ICD-10-CM | POA: Diagnosis not present

## 2021-12-05 DIAGNOSIS — E785 Hyperlipidemia, unspecified: Secondary | ICD-10-CM | POA: Diagnosis not present

## 2021-12-05 DIAGNOSIS — C419 Malignant neoplasm of bone and articular cartilage, unspecified: Secondary | ICD-10-CM | POA: Diagnosis not present

## 2021-12-05 DIAGNOSIS — S7290XA Unspecified fracture of unspecified femur, initial encounter for closed fracture: Secondary | ICD-10-CM | POA: Diagnosis not present

## 2021-12-05 DIAGNOSIS — M169 Osteoarthritis of hip, unspecified: Secondary | ICD-10-CM | POA: Diagnosis not present

## 2021-12-07 DIAGNOSIS — M25562 Pain in left knee: Secondary | ICD-10-CM | POA: Diagnosis not present

## 2021-12-07 DIAGNOSIS — G8929 Other chronic pain: Secondary | ICD-10-CM | POA: Diagnosis not present

## 2021-12-07 DIAGNOSIS — M5136 Other intervertebral disc degeneration, lumbar region: Secondary | ICD-10-CM | POA: Diagnosis not present

## 2021-12-07 DIAGNOSIS — M25561 Pain in right knee: Secondary | ICD-10-CM | POA: Diagnosis not present

## 2021-12-14 DIAGNOSIS — M25561 Pain in right knee: Secondary | ICD-10-CM | POA: Diagnosis not present

## 2022-01-04 DIAGNOSIS — M25561 Pain in right knee: Secondary | ICD-10-CM | POA: Diagnosis not present

## 2022-01-18 DIAGNOSIS — M25561 Pain in right knee: Secondary | ICD-10-CM | POA: Diagnosis not present

## 2022-01-18 DIAGNOSIS — M5136 Other intervertebral disc degeneration, lumbar region: Secondary | ICD-10-CM | POA: Diagnosis not present

## 2022-01-18 DIAGNOSIS — M25562 Pain in left knee: Secondary | ICD-10-CM | POA: Diagnosis not present

## 2022-01-18 DIAGNOSIS — G8929 Other chronic pain: Secondary | ICD-10-CM | POA: Diagnosis not present

## 2022-01-23 DIAGNOSIS — S82221D Displaced transverse fracture of shaft of right tibia, subsequent encounter for closed fracture with routine healing: Secondary | ICD-10-CM | POA: Diagnosis not present

## 2022-01-23 DIAGNOSIS — M25561 Pain in right knee: Secondary | ICD-10-CM | POA: Diagnosis not present

## 2022-01-25 DIAGNOSIS — M25561 Pain in right knee: Secondary | ICD-10-CM | POA: Diagnosis not present

## 2022-02-14 DIAGNOSIS — M25562 Pain in left knee: Secondary | ICD-10-CM | POA: Diagnosis not present

## 2022-02-14 DIAGNOSIS — G8929 Other chronic pain: Secondary | ICD-10-CM | POA: Diagnosis not present

## 2022-02-14 DIAGNOSIS — M25561 Pain in right knee: Secondary | ICD-10-CM | POA: Diagnosis not present

## 2022-02-14 DIAGNOSIS — M5136 Other intervertebral disc degeneration, lumbar region: Secondary | ICD-10-CM | POA: Diagnosis not present

## 2022-02-14 DIAGNOSIS — N183 Chronic kidney disease, stage 3 unspecified: Secondary | ICD-10-CM | POA: Diagnosis not present

## 2022-03-15 DIAGNOSIS — M79604 Pain in right leg: Secondary | ICD-10-CM | POA: Diagnosis not present

## 2022-03-20 DIAGNOSIS — I10 Essential (primary) hypertension: Secondary | ICD-10-CM | POA: Diagnosis not present

## 2022-03-20 DIAGNOSIS — Z6841 Body Mass Index (BMI) 40.0 and over, adult: Secondary | ICD-10-CM | POA: Diagnosis not present

## 2022-03-20 DIAGNOSIS — M79672 Pain in left foot: Secondary | ICD-10-CM | POA: Diagnosis not present

## 2022-03-20 DIAGNOSIS — M5416 Radiculopathy, lumbar region: Secondary | ICD-10-CM | POA: Diagnosis not present

## 2022-03-20 DIAGNOSIS — G8929 Other chronic pain: Secondary | ICD-10-CM | POA: Diagnosis not present

## 2022-03-20 DIAGNOSIS — M79671 Pain in right foot: Secondary | ICD-10-CM | POA: Diagnosis not present

## 2022-04-08 DIAGNOSIS — E785 Hyperlipidemia, unspecified: Secondary | ICD-10-CM | POA: Diagnosis not present

## 2022-04-08 DIAGNOSIS — G990 Autonomic neuropathy in diseases classified elsewhere: Secondary | ICD-10-CM | POA: Diagnosis not present

## 2022-04-08 DIAGNOSIS — Z6841 Body Mass Index (BMI) 40.0 and over, adult: Secondary | ICD-10-CM | POA: Diagnosis not present

## 2022-04-08 DIAGNOSIS — F329 Major depressive disorder, single episode, unspecified: Secondary | ICD-10-CM | POA: Diagnosis not present

## 2022-04-08 DIAGNOSIS — J45909 Unspecified asthma, uncomplicated: Secondary | ICD-10-CM | POA: Diagnosis not present

## 2022-04-08 DIAGNOSIS — C419 Malignant neoplasm of bone and articular cartilage, unspecified: Secondary | ICD-10-CM | POA: Diagnosis not present

## 2022-04-08 DIAGNOSIS — M170 Bilateral primary osteoarthritis of knee: Secondary | ICD-10-CM | POA: Diagnosis not present

## 2022-04-08 DIAGNOSIS — I10 Essential (primary) hypertension: Secondary | ICD-10-CM | POA: Diagnosis not present

## 2022-04-18 DIAGNOSIS — S82201A Unspecified fracture of shaft of right tibia, initial encounter for closed fracture: Secondary | ICD-10-CM

## 2022-04-18 HISTORY — DX: Unspecified fracture of shaft of right tibia, initial encounter for closed fracture: S82.201A

## 2022-04-19 DIAGNOSIS — Z6841 Body Mass Index (BMI) 40.0 and over, adult: Secondary | ICD-10-CM | POA: Diagnosis not present

## 2022-04-19 DIAGNOSIS — M79672 Pain in left foot: Secondary | ICD-10-CM | POA: Diagnosis not present

## 2022-04-19 DIAGNOSIS — G8929 Other chronic pain: Secondary | ICD-10-CM | POA: Diagnosis not present

## 2022-04-19 DIAGNOSIS — M5416 Radiculopathy, lumbar region: Secondary | ICD-10-CM | POA: Diagnosis not present

## 2022-04-19 DIAGNOSIS — I10 Essential (primary) hypertension: Secondary | ICD-10-CM | POA: Diagnosis not present

## 2022-04-19 DIAGNOSIS — M79671 Pain in right foot: Secondary | ICD-10-CM | POA: Diagnosis not present

## 2022-04-29 DIAGNOSIS — M25561 Pain in right knee: Secondary | ICD-10-CM | POA: Diagnosis not present

## 2022-04-29 DIAGNOSIS — M170 Bilateral primary osteoarthritis of knee: Secondary | ICD-10-CM | POA: Diagnosis not present

## 2022-04-29 DIAGNOSIS — M25562 Pain in left knee: Secondary | ICD-10-CM | POA: Diagnosis not present

## 2022-04-29 DIAGNOSIS — S82221K Displaced transverse fracture of shaft of right tibia, subsequent encounter for closed fracture with nonunion: Secondary | ICD-10-CM | POA: Diagnosis not present

## 2022-05-01 ENCOUNTER — Other Ambulatory Visit: Payer: Self-pay | Admitting: Orthopedic Surgery

## 2022-05-07 ENCOUNTER — Encounter
Admission: RE | Admit: 2022-05-07 | Discharge: 2022-05-07 | Disposition: A | Discharge status: Home / Self Care | Payer: Medicare HMO | Source: Ambulatory Visit | Attending: Orthopedic Surgery | Admitting: Orthopedic Surgery

## 2022-05-07 VITALS — BP 109/63 | HR 65 | Temp 97.8°F | Resp 18 | Ht 63.0 in | Wt 270.0 lb

## 2022-05-07 DIAGNOSIS — Z01818 Encounter for other preprocedural examination: Secondary | ICD-10-CM | POA: Insufficient documentation

## 2022-05-07 DIAGNOSIS — Z01812 Encounter for preprocedural laboratory examination: Secondary | ICD-10-CM

## 2022-05-07 DIAGNOSIS — Z01810 Encounter for preprocedural cardiovascular examination: Secondary | ICD-10-CM | POA: Diagnosis not present

## 2022-05-07 HISTORY — DX: Restless legs syndrome: G25.81

## 2022-05-07 HISTORY — DX: Hyperlipidemia, unspecified: E78.5

## 2022-05-07 HISTORY — DX: Gastro-esophageal reflux disease without esophagitis: K21.9

## 2022-05-07 HISTORY — DX: Unspecified osteoarthritis, unspecified site: M19.90

## 2022-05-07 HISTORY — DX: Essential (primary) hypertension: I10

## 2022-05-07 HISTORY — DX: Polyneuropathy, unspecified: G62.9

## 2022-05-07 HISTORY — DX: Depression, unspecified: F32.A

## 2022-05-07 HISTORY — DX: Unspecified asthma, uncomplicated: J45.909

## 2022-05-07 HISTORY — DX: Malignant neoplasm of bone and articular cartilage, unspecified: C41.9

## 2022-05-07 LAB — COMPREHENSIVE METABOLIC PANEL
ALT: 19 U/L (ref 0–44)
AST: 22 U/L (ref 15–41)
Albumin: 4.1 g/dL (ref 3.5–5.0)
Alkaline Phosphatase: 102 U/L (ref 38–126)
Anion gap: 9 (ref 5–15)
BUN: 24 mg/dL — ABNORMAL HIGH (ref 8–23)
CO2: 29 mmol/L (ref 22–32)
Calcium: 9.6 mg/dL (ref 8.9–10.3)
Chloride: 104 mmol/L (ref 98–111)
Creatinine, Ser: 0.65 mg/dL (ref 0.44–1.00)
GFR, Estimated: >60 mL/min (ref >60)
Glucose, Bld: 88 mg/dL (ref 70–99)
Potassium: 3.9 mmol/L (ref 3.5–5.1)
Sodium: 142 mmol/L (ref 135–145)
Total Bilirubin: 0.6 mg/dL (ref 0.3–1.2)
Total Protein: 7.3 g/dL (ref 6.5–8.1)

## 2022-05-07 LAB — CBC WITH DIFFERENTIAL/PLATELET
Abs Immature Granulocytes: 0.02 10*3/uL (ref 0.00–0.07)
Basophils Absolute: 0.1 10*3/uL (ref 0.0–0.1)
Basophils Relative: 1 %
Eosinophils Absolute: 0.3 10*3/uL (ref 0.0–0.5)
Eosinophils Relative: 3 %
HCT: 37.9 % (ref 36.0–46.0)
Hemoglobin: 12.3 g/dL (ref 12.0–15.0)
Immature Granulocytes: 0 %
Lymphocytes Relative: 26 %
Lymphs Abs: 2 10*3/uL (ref 0.7–4.0)
MCH: 30.8 pg (ref 26.0–34.0)
MCHC: 32.5 g/dL (ref 30.0–36.0)
MCV: 94.8 fL (ref 80.0–100.0)
Monocytes Absolute: 0.5 10*3/uL (ref 0.1–1.0)
Monocytes Relative: 6 %
Neutro Abs: 4.9 10*3/uL (ref 1.7–7.7)
Neutrophils Relative %: 64 %
Platelets: 283 10*3/uL (ref 150–400)
RBC: 4 MIL/uL (ref 3.87–5.11)
RDW: 13.4 % (ref 11.5–15.5)
WBC: 7.7 10*3/uL (ref 4.0–10.5)
nRBC: 0 % (ref 0.0–0.2)

## 2022-05-07 LAB — URINALYSIS, ROUTINE W REFLEX MICROSCOPIC
Bilirubin Urine: NEGATIVE
Glucose, UA: NEGATIVE mg/dL
Hgb urine dipstick: NEGATIVE
Ketones, ur: NEGATIVE mg/dL
Leukocytes,Ua: NEGATIVE
Nitrite: NEGATIVE
Protein, ur: NEGATIVE mg/dL
Specific Gravity, Urine: 1.023 (ref 1.005–1.030)
pH: 6 (ref 5.0–8.0)

## 2022-05-07 LAB — TYPE AND SCREEN
ABO/RH(D): A POS
Antibody Screen: NEGATIVE

## 2022-05-07 LAB — SURGICAL PCR SCREEN
MRSA, PCR: NEGATIVE
Staphylococcus aureus: NEGATIVE

## 2022-05-07 NOTE — Patient Instructions (Addendum)
Your procedure is scheduled on: Friday May 10, 2022. Report to Day Surgery inside [facility] 2nd floor, stop by admissions desk before getting on elevator.  To find out your arrival time please call [phone] between 1PM - 3PM on Thursday May 09, 2022.  Remember: Instructions that are not followed completely may result in serious medical risk,  up to and including death, or upon the discretion of your surgeon and anesthesiologist your  surgery may need to be rescheduled.     _X__ 1. Do not eat food after midnight the night before your procedure.                 No chewing gum or hard candies. You may drink clear liquids up to 2 hours                 before you are scheduled to arrive for your surgery- DO not drink clear                 liquids within 2 hours of the start of your surgery.                 Clear Liquids include:  water, apple juice without pulp, clear Gatorade, G2 or                  Gatorade Zero (avoid Red/Purple/Blue), Black Coffee or Tea (Do not add                 anything to coffee or tea).  __X__2.   Complete the "Ensure Clear Pre-surgery Clear Carbohydrate Drink" provided to you, 2 hours before arrival. **If you are diabetic you will be provided with an alternative drink, Gatorade Zero or G2.  __X__3.  On the morning of surgery brush your teeth with toothpaste and water, you                may rinse your mouth with mouthwash if you wish.  Do not swallow any toothpaste of mouthwash.     _X__ 4.  No Alcohol for 24 hours before or after surgery.   _X__ 5.  Do Not Smoke or use e-cigarettes For 24 Hours Prior to Your Surgery.                 Do not use any chewable tobacco products for at least 6 hours prior to                 Surgery.  _X__  6.  Do not use any recreational drugs (marijuana, cocaine, heroin, ecstasy, MDMA or other)                For at least one week prior to your surgery.  Combination of these drugs with anesthesia                 May have life threatening results.  ____  7.  Bring all medications with you on the day of surgery if instructed.   __X_8.  Notify your doctor if there is any change in your medical condition      (cold, fever, infections).     Do not wear jewelry, make-up, hairpins, clips or nail polish. Do not wear lotions, powders, or perfumes. You may wear deodorant. Do not shave 48 hours prior to surgery. Men may shave face and neck. Do not bring valuables to the hospital.    [facility] is not responsible for any belongings or valuables.  Contacts,  dentures or bridgework may not be worn into surgery. Leave your suitcase in the car. After surgery it may be brought to your room. For patients admitted to the hospital, discharge time is determined by your treatment team.   Patients discharged the day of surgery will not be allowed to drive home.   Make arrangements for someone to be with you for the first 24 hours of your Same Day Discharge.   __X__ Take these medicines the morning of surgery with A SIP OF WATER:    1. gabapentin (NEURONTIN) 300 MG  2. pantoprazole (PROTONIX) 40 MG  3. rOPINIRole (REQUIP) 1 MG  4. oxyCODONE (OXYCONTIN) 10 mg  5.  6.  ____ Fleet Enema (as directed)   __X__ Use CHG Soap (or wipes) as directed  ____ Use Benzoyl Peroxide Gel as instructed  ____ Use inhalers on the day of surgery  ____ Stop metformin 2 days prior to surgery    ____ Take 1/2 of usual insulin dose the night before surgery. No insulin the morning          of surgery.   ____ Call your PCP, cardiologist, or Pulmonologist if taking Coumadin/Plavix/aspirin and ask when to stop before your surgery.   __X__ One Week prior to surgery- Stop Anti-inflammatories such as Ibuprofen, Aleve, Advil, Motrin, meloxicam (MOBIC), diclofenac, etodolac, ketorolac, Toradol, Daypro, piroxicam, Goody's or BC powders. OK TO USE TYLENOL IF NEEDED   __X__ Stop supplements until after surgery.    ____  Bring C-Pap to the hospital.    If you have any questions regarding your pre-procedure instructions,  Please call Pre-admit Testing at [phone]

## 2022-05-09 MED ORDER — CEFAZOLIN IN SODIUM CHLORIDE 3-0.9 GM/100ML-% IV SOLN
3.0000 g | INTRAVENOUS | Status: AC
  Administered 2022-05-10: 3 g via INTRAVENOUS
  Filled 2022-05-09: qty 100

## 2022-05-09 MED ORDER — LACTATED RINGERS IV SOLN: INTRAVENOUS | Status: DC

## 2022-05-09 MED ORDER — ORAL CARE MOUTH RINSE: 15.0000 mL | Freq: Once | OROMUCOSAL | Status: AC

## 2022-05-09 MED ORDER — CHLORHEXIDINE GLUCONATE 0.12 % MT SOLN: 15.0000 mL | Freq: Once | OROMUCOSAL | Status: AC

## 2022-05-10 ENCOUNTER — Ambulatory Visit: Payer: Medicare HMO | Admitting: Anesthesiology

## 2022-05-10 ENCOUNTER — Observation Stay
Admission: RE | Admit: 2022-05-10 | Discharge: 2022-05-13 | Disposition: A | Discharge status: Home Health | Payer: Medicare HMO | Attending: Orthopedic Surgery | Admitting: Orthopedic Surgery

## 2022-05-10 ENCOUNTER — Encounter: Payer: Self-pay | Admitting: Orthopedic Surgery

## 2022-05-10 ENCOUNTER — Other Ambulatory Visit: Payer: Self-pay

## 2022-05-10 ENCOUNTER — Encounter
Admission: RE | Disposition: A | Discharge status: Home Health | Payer: Self-pay | Source: Home / Self Care | Attending: Orthopedic Surgery

## 2022-05-10 ENCOUNTER — Ambulatory Visit: Payer: Medicare HMO | Admitting: Urgent Care

## 2022-05-10 ENCOUNTER — Ambulatory Visit: Payer: Medicare HMO

## 2022-05-10 DIAGNOSIS — J45909 Unspecified asthma, uncomplicated: Secondary | ICD-10-CM | POA: Insufficient documentation

## 2022-05-10 DIAGNOSIS — Z85830 Personal history of malignant neoplasm of bone: Secondary | ICD-10-CM | POA: Diagnosis not present

## 2022-05-10 DIAGNOSIS — M1711 Unilateral primary osteoarthritis, right knee: Secondary | ICD-10-CM | POA: Diagnosis not present

## 2022-05-10 DIAGNOSIS — M170 Bilateral primary osteoarthritis of knee: Secondary | ICD-10-CM | POA: Diagnosis not present

## 2022-05-10 DIAGNOSIS — S82221K Displaced transverse fracture of shaft of right tibia, subsequent encounter for closed fracture with nonunion: Principal | ICD-10-CM | POA: Diagnosis present

## 2022-05-10 DIAGNOSIS — X58XXXA Exposure to other specified factors, initial encounter: Secondary | ICD-10-CM | POA: Insufficient documentation

## 2022-05-10 DIAGNOSIS — I10 Essential (primary) hypertension: Secondary | ICD-10-CM | POA: Diagnosis not present

## 2022-05-10 DIAGNOSIS — Z79899 Other long term (current) drug therapy: Secondary | ICD-10-CM | POA: Insufficient documentation

## 2022-05-10 DIAGNOSIS — M25561 Pain in right knee: Secondary | ICD-10-CM | POA: Diagnosis present

## 2022-05-10 DIAGNOSIS — Z0389 Encounter for observation for other suspected diseases and conditions ruled out: Secondary | ICD-10-CM | POA: Diagnosis not present

## 2022-05-10 DIAGNOSIS — M25562 Pain in left knee: Secondary | ICD-10-CM | POA: Insufficient documentation

## 2022-05-10 DIAGNOSIS — F32A Depression, unspecified: Secondary | ICD-10-CM | POA: Diagnosis not present

## 2022-05-10 HISTORY — PX: TIBIA IM NAIL INSERTION: SHX2516

## 2022-05-10 LAB — CBC
HCT: 36.1 % (ref 36.0–46.0)
Hemoglobin: 12.1 g/dL (ref 12.0–15.0)
MCH: 31.2 pg (ref 26.0–34.0)
MCHC: 33.5 g/dL (ref 30.0–36.0)
MCV: 93 fL (ref 80.0–100.0)
Platelets: 273 10*3/uL (ref 150–400)
RBC: 3.88 MIL/uL (ref 3.87–5.11)
RDW: 13.3 % (ref 11.5–15.5)
WBC: 15.3 10*3/uL — ABNORMAL HIGH (ref 4.0–10.5)
nRBC: 0 % (ref 0.0–0.2)

## 2022-05-10 LAB — CREATININE, SERUM
Creatinine, Ser: 0.66 mg/dL (ref 0.44–1.00)
GFR, Estimated: >60 mL/min (ref >60)

## 2022-05-10 LAB — ABO/RH: ABO/RH(D): A POS

## 2022-05-10 SURGERY — INSERTION, INTRAMEDULLARY ROD, TIBIA
Anesthesia: General | Site: Leg Upper | Laterality: Right

## 2022-05-10 MED ORDER — HYDROMORPHONE HCL 1 MG/ML IJ SOLN
0.5000 mg | INTRAMUSCULAR | Status: AC | PRN
  Administered 2022-05-10: 0.5 mg via INTRAVENOUS

## 2022-05-10 MED ORDER — OXYCODONE HCL 5 MG PO TABS: 5.0000 mg | ORAL_TABLET | ORAL | Status: DC | PRN

## 2022-05-10 MED ORDER — CHLORHEXIDINE GLUCONATE 0.12 % MT SOLN
OROMUCOSAL | Status: AC
  Administered 2022-05-10: 15 mL via OROMUCOSAL
  Filled 2022-05-10: qty 15

## 2022-05-10 MED ORDER — BISACODYL 5 MG PO TBEC
5.0000 mg | DELAYED_RELEASE_TABLET | Freq: Every day | ORAL | Status: DC | PRN
  Administered 2022-05-12: 5 mg via ORAL
  Filled 2022-05-10: qty 1

## 2022-05-10 MED ORDER — OXYCODONE HCL 5 MG PO TABS: 10.0000 mg | ORAL_TABLET | ORAL | Status: DC | PRN

## 2022-05-10 MED ORDER — PHENYLEPHRINE HCL (PRESSORS) 10 MG/ML IV SOLN
INTRAVENOUS | Status: DC | PRN
  Administered 2022-05-10: 80 ug via INTRAVENOUS

## 2022-05-10 MED ORDER — LIDOCAINE HCL (CARDIAC) PF 100 MG/5ML IV SOSY
PREFILLED_SYRINGE | INTRAVENOUS | Status: DC | PRN
  Administered 2022-05-10: 50 mg via INTRAVENOUS

## 2022-05-10 MED ORDER — ONDANSETRON HCL 4 MG/2ML IJ SOLN
INTRAMUSCULAR | Status: DC | PRN
  Administered 2022-05-10: 4 mg via INTRAVENOUS

## 2022-05-10 MED ORDER — METHOCARBAMOL 1000 MG/10ML IJ SOLN: 500.0000 mg | Freq: Four times a day (QID) | INTRAVENOUS | Status: DC | PRN

## 2022-05-10 MED ORDER — ROCURONIUM BROMIDE 10 MG/ML (PF) SYRINGE
PREFILLED_SYRINGE | INTRAVENOUS | Status: AC
Start: 2022-05-10 — End: ?
  Filled 2022-05-10: qty 10

## 2022-05-10 MED ORDER — ZOLPIDEM TARTRATE 5 MG PO TABS: 5.0000 mg | ORAL_TABLET | Freq: Every evening | ORAL | Status: DC | PRN

## 2022-05-10 MED ORDER — ONDANSETRON HCL 4 MG/2ML IJ SOLN: 4.0000 mg | Freq: Four times a day (QID) | INTRAMUSCULAR | Status: DC | PRN

## 2022-05-10 MED ORDER — SERTRALINE HCL 50 MG PO TABS
150.0000 mg | ORAL_TABLET | Freq: Every day | ORAL | Status: DC
  Administered 2022-05-11 – 2022-05-13 (×3): 150 mg via ORAL
  Filled 2022-05-10 (×3): qty 3

## 2022-05-10 MED ORDER — DEXAMETHASONE SODIUM PHOSPHATE 10 MG/ML IJ SOLN
INTRAMUSCULAR | Status: DC | PRN
  Administered 2022-05-10: 10 mg via INTRAVENOUS

## 2022-05-10 MED ORDER — PANTOPRAZOLE SODIUM 40 MG PO TBEC
40.0000 mg | DELAYED_RELEASE_TABLET | Freq: Every day | ORAL | Status: DC
  Administered 2022-05-11 – 2022-05-13 (×3): 40 mg via ORAL
  Filled 2022-05-10 (×3): qty 1

## 2022-05-10 MED ORDER — PHENYLEPHRINE 80 MCG/ML (10ML) SYRINGE FOR IV PUSH (FOR BLOOD PRESSURE SUPPORT)
PREFILLED_SYRINGE | INTRAVENOUS | Status: AC
Start: 2022-05-10 — End: ?
  Filled 2022-05-10: qty 10

## 2022-05-10 MED ORDER — METOCLOPRAMIDE HCL 5 MG PO TABS: 5.0000 mg | ORAL_TABLET | Freq: Three times a day (TID) | ORAL | Status: DC | PRN

## 2022-05-10 MED ORDER — FLEET ENEMA 7-19 GM/118ML RE ENEM: 1.0000 | ENEMA | Freq: Once | RECTAL | Status: DC | PRN

## 2022-05-10 MED ORDER — MAGNESIUM OXIDE -MG SUPPLEMENT 400 (240 MG) MG PO TABS
400.0000 mg | ORAL_TABLET | Freq: Every day | ORAL | Status: DC
  Administered 2022-05-11 – 2022-05-13 (×3): 400 mg via ORAL
  Filled 2022-05-10 (×3): qty 1

## 2022-05-10 MED ORDER — ONDANSETRON HCL 4 MG/2ML IJ SOLN
INTRAMUSCULAR | Status: AC
Start: 2022-05-10 — End: ?
  Filled 2022-05-10: qty 2

## 2022-05-10 MED ORDER — GLYCOPYRROLATE 0.2 MG/ML IJ SOLN
INTRAMUSCULAR | Status: DC | PRN
  Administered 2022-05-10: .2 mg via INTRAVENOUS

## 2022-05-10 MED ORDER — HYDROMORPHONE HCL 1 MG/ML IJ SOLN
INTRAMUSCULAR | Status: AC
  Administered 2022-05-10: 0.5 mg via INTRAVENOUS
  Filled 2022-05-10: qty 1

## 2022-05-10 MED ORDER — HYDROMORPHONE HCL 1 MG/ML IJ SOLN
0.5000 mg | INTRAMUSCULAR | Status: DC | PRN
  Administered 2022-05-10: 0.5 mg via INTRAVENOUS

## 2022-05-10 MED ORDER — ROCURONIUM BROMIDE 100 MG/10ML IV SOLN
INTRAVENOUS | Status: DC | PRN
  Administered 2022-05-10: 50 mg via INTRAVENOUS
  Administered 2022-05-10: 10 mg via INTRAVENOUS

## 2022-05-10 MED ORDER — OXYCODONE HCL 5 MG PO TABS
ORAL_TABLET | ORAL | Status: AC
  Filled 2022-05-10: qty 2

## 2022-05-10 MED ORDER — FENTANYL CITRATE (PF) 100 MCG/2ML IJ SOLN
INTRAMUSCULAR | Status: AC
  Filled 2022-05-10: qty 2

## 2022-05-10 MED ORDER — EPHEDRINE 5 MG/ML INJ
INTRAVENOUS | Status: AC
  Filled 2022-05-10: qty 5

## 2022-05-10 MED ORDER — DEXAMETHASONE SODIUM PHOSPHATE 10 MG/ML IJ SOLN
INTRAMUSCULAR | Status: AC
  Filled 2022-05-10: qty 1

## 2022-05-10 MED ORDER — EPHEDRINE SULFATE (PRESSORS) 50 MG/ML IJ SOLN
INTRAMUSCULAR | Status: DC | PRN
  Administered 2022-05-10: 5 mg via INTRAVENOUS

## 2022-05-10 MED ORDER — METOCLOPRAMIDE HCL 5 MG/ML IJ SOLN: 5.0000 mg | Freq: Three times a day (TID) | INTRAMUSCULAR | Status: DC | PRN

## 2022-05-10 MED ORDER — METHOCARBAMOL 500 MG PO TABS
500.0000 mg | ORAL_TABLET | Freq: Four times a day (QID) | ORAL | Status: DC | PRN
  Administered 2022-05-12: 500 mg via ORAL
  Filled 2022-05-10: qty 1

## 2022-05-10 MED ORDER — ONDANSETRON HCL 4 MG/2ML IJ SOLN: 4.0000 mg | Freq: Once | INTRAMUSCULAR | Status: DC | PRN

## 2022-05-10 MED ORDER — ACETAMINOPHEN 325 MG PO TABS: 325.0000 mg | ORAL_TABLET | Freq: Four times a day (QID) | ORAL | Status: DC | PRN

## 2022-05-10 MED ORDER — GLYCOPYRROLATE 0.2 MG/ML IJ SOLN
INTRAMUSCULAR | Status: AC
  Filled 2022-05-10: qty 1

## 2022-05-10 MED ORDER — FENTANYL CITRATE (PF) 100 MCG/2ML IJ SOLN
INTRAMUSCULAR | Status: DC | PRN
  Administered 2022-05-10 (×2): 50 ug via INTRAVENOUS

## 2022-05-10 MED ORDER — TRAMADOL HCL 50 MG PO TABS
50.0000 mg | ORAL_TABLET | Freq: Four times a day (QID) | ORAL | Status: DC
  Administered 2022-05-10 – 2022-05-13 (×12): 50 mg via ORAL
  Filled 2022-05-10 (×12): qty 1

## 2022-05-10 MED ORDER — SODIUM CHLORIDE 0.9 % IV SOLN: INTRAVENOUS | Status: DC

## 2022-05-10 MED ORDER — PRAVASTATIN SODIUM 20 MG PO TABS
20.0000 mg | ORAL_TABLET | Freq: Every day | ORAL | Status: DC
  Administered 2022-05-10 – 2022-05-12 (×3): 20 mg via ORAL
  Filled 2022-05-10 (×4): qty 1

## 2022-05-10 MED ORDER — MIDAZOLAM HCL 2 MG/2ML IJ SOLN
INTRAMUSCULAR | Status: AC
  Filled 2022-05-10: qty 2

## 2022-05-10 MED ORDER — OXYCODONE HCL ER 10 MG PO T12A
10.0000 mg | EXTENDED_RELEASE_TABLET | Freq: Four times a day (QID) | ORAL | Status: DC
  Administered 2022-05-10 – 2022-05-13 (×11): 10 mg via ORAL
  Filled 2022-05-10 (×11): qty 1

## 2022-05-10 MED ORDER — SUGAMMADEX SODIUM 200 MG/2ML IV SOLN
INTRAVENOUS | Status: DC | PRN
  Administered 2022-05-10 (×2): 100 mg via INTRAVENOUS

## 2022-05-10 MED ORDER — PROPOFOL 10 MG/ML IV BOLUS
INTRAVENOUS | Status: AC
  Filled 2022-05-10: qty 20

## 2022-05-10 MED ORDER — BUPIVACAINE HCL (PF) 0.5 % IJ SOLN
INTRAMUSCULAR | Status: DC | PRN
  Administered 2022-05-10: 28 mL

## 2022-05-10 MED ORDER — MELOXICAM 7.5 MG PO TABS
15.0000 mg | ORAL_TABLET | Freq: Every day | ORAL | Status: DC
  Administered 2022-05-11 – 2022-05-13 (×3): 15 mg via ORAL
  Filled 2022-05-10 (×3): qty 2

## 2022-05-10 MED ORDER — SODIUM CHLORIDE 0.9 % IR SOLN
Status: DC | PRN
  Administered 2022-05-10: 1004 mL

## 2022-05-10 MED ORDER — OXYCODONE HCL 5 MG PO TABS
10.0000 mg | ORAL_TABLET | Freq: Once | ORAL | Status: AC
  Administered 2022-05-10: 10 mg via ORAL

## 2022-05-10 MED ORDER — BUPIVACAINE HCL (PF) 0.5 % IJ SOLN
INTRAMUSCULAR | Status: AC
  Filled 2022-05-10: qty 30

## 2022-05-10 MED ORDER — LISINOPRIL 10 MG PO TABS
10.0000 mg | ORAL_TABLET | Freq: Every day | ORAL | Status: DC
  Administered 2022-05-12: 10 mg via ORAL
  Filled 2022-05-10 (×2): qty 1

## 2022-05-10 MED ORDER — DOCUSATE SODIUM 100 MG PO CAPS
100.0000 mg | ORAL_CAPSULE | Freq: Two times a day (BID) | ORAL | Status: DC
  Administered 2022-05-10 – 2022-05-13 (×6): 100 mg via ORAL
  Filled 2022-05-10 (×6): qty 1

## 2022-05-10 MED ORDER — PROPOFOL 10 MG/ML IV BOLUS
INTRAVENOUS | Status: DC | PRN
  Administered 2022-05-10: 150 mg via INTRAVENOUS

## 2022-05-10 MED ORDER — ENOXAPARIN SODIUM 40 MG/0.4ML IJ SOSY
40.0000 mg | PREFILLED_SYRINGE | INTRAMUSCULAR | Status: DC
  Administered 2022-05-11 – 2022-05-13 (×3): 40 mg via SUBCUTANEOUS
  Filled 2022-05-10 (×3): qty 0.4

## 2022-05-10 MED ORDER — TRIAMTERENE-HCTZ 37.5-25 MG PO TABS
1.0000 | ORAL_TABLET | Freq: Every day | ORAL | Status: DC
  Administered 2022-05-12: 1 via ORAL
  Filled 2022-05-10 (×3): qty 1

## 2022-05-10 MED ORDER — HYDROMORPHONE HCL 1 MG/ML IJ SOLN
0.5000 mg | INTRAMUSCULAR | Status: DC | PRN
  Administered 2022-05-10: 1 mg via INTRAVENOUS
  Filled 2022-05-10: qty 1

## 2022-05-10 MED ORDER — LIDOCAINE HCL (PF) 2 % IJ SOLN
INTRAMUSCULAR | Status: AC
  Filled 2022-05-10: qty 5

## 2022-05-10 MED ORDER — GABAPENTIN 300 MG PO CAPS
300.0000 mg | ORAL_CAPSULE | Freq: Three times a day (TID) | ORAL | Status: DC
  Administered 2022-05-10 – 2022-05-13 (×9): 300 mg via ORAL
  Filled 2022-05-10 (×9): qty 1

## 2022-05-10 MED ORDER — ROPINIROLE HCL 1 MG PO TABS
1.0000 mg | ORAL_TABLET | Freq: Three times a day (TID) | ORAL | Status: DC
Start: 2022-05-10 — End: 2022-05-13
  Administered 2022-05-10 – 2022-05-13 (×9): 1 mg via ORAL
  Filled 2022-05-10 (×9): qty 1

## 2022-05-10 MED ORDER — ONDANSETRON HCL 4 MG PO TABS: 4.0000 mg | ORAL_TABLET | Freq: Four times a day (QID) | ORAL | Status: DC | PRN

## 2022-05-10 MED ORDER — FENTANYL CITRATE (PF) 100 MCG/2ML IJ SOLN
25.0000 ug | INTRAMUSCULAR | Status: DC | PRN
  Administered 2022-05-10 (×3): 50 ug via INTRAVENOUS

## 2022-05-10 MED ORDER — CEFAZOLIN SODIUM-DEXTROSE 2-4 GM/100ML-% IV SOLN
2.0000 g | Freq: Four times a day (QID) | INTRAVENOUS | Status: AC
  Administered 2022-05-10 – 2022-05-11 (×3): 2 g via INTRAVENOUS
  Filled 2022-05-10 (×3): qty 100

## 2022-05-10 MED ORDER — MIDAZOLAM HCL 2 MG/2ML IJ SOLN
INTRAMUSCULAR | Status: DC | PRN
  Administered 2022-05-10: 2 mg via INTRAVENOUS

## 2022-05-10 MED ORDER — ACETAMINOPHEN 500 MG PO TABS
1000.0000 mg | ORAL_TABLET | Freq: Four times a day (QID) | ORAL | Status: AC
  Administered 2022-05-10 – 2022-05-11 (×4): 1000 mg via ORAL
  Filled 2022-05-10 (×4): qty 2

## 2022-05-10 MED ORDER — SENNOSIDES-DOCUSATE SODIUM 8.6-50 MG PO TABS: 1.0000 | ORAL_TABLET | Freq: Every evening | ORAL | Status: DC | PRN

## 2022-05-10 MED ORDER — DIPHENHYDRAMINE HCL 12.5 MG/5ML PO ELIX: 12.5000 mg | ORAL_SOLUTION | ORAL | Status: DC | PRN

## 2022-05-10 SURGICAL SUPPLY — 58 items
12MM CANNULATED FLEXIBLE DRILL BIT ×1 IMPLANT
3.8MM REAMING ROD BALL TIP ×1 IMPLANT
4.2MM EXTRA LONG CALIBRATED ×1 IMPLANT
BIT DRILL FLEXIBLE LONG 12 (BIT) ×1 IMPLANT
BIT DRILL LONG 4.2 (BIT) ×1 IMPLANT
BIT DRILL SHORT 4.2 (BIT) IMPLANT
BLADE CLIPPER SURG (BLADE) ×2 IMPLANT
BLADE SAW SGTL 13X75X1.27 (BLADE) ×1 IMPLANT
BNDG ELASTIC 6X5.8 VLCR STR LF (GAUZE/BANDAGES/DRESSINGS) ×1 IMPLANT
CHLORAPREP W/TINT 26 (MISCELLANEOUS) ×2 IMPLANT
CUFF TOURN SGL QUICK 24 (TOURNIQUET CUFF)
CUFF TOURN SGL QUICK 34 (TOURNIQUET CUFF)
CUFF TRNQT CYL 24X4X16.5-23 (TOURNIQUET CUFF) IMPLANT
CUFF TRNQT CYL 34X4.125X (TOURNIQUET CUFF) IMPLANT
DRAPE C-ARM XRAY 36X54 (DRAPES) ×2 IMPLANT
DRAPE C-ARMOR (DRAPES) ×2 IMPLANT
DRILL BIT 4.2MM X 145 ×1 IMPLANT
DRILL BIT SHORT 4.2 (BIT) ×1
ELECT CAUTERY BLADE 6.4 (BLADE) ×2 IMPLANT
ELECT REM PT RETURN 9FT ADLT (ELECTROSURGICAL) ×2
ELECTRODE REM PT RTRN 9FT ADLT (ELECTROSURGICAL) ×1 IMPLANT
GAUZE SPONGE 4X4 12PLY STRL (GAUZE/BANDAGES/DRESSINGS) ×3 IMPLANT
GAUZE XEROFORM 1X8 LF (GAUZE/BANDAGES/DRESSINGS) ×2 IMPLANT
GLOVE BIOGEL PI IND STRL 9 (GLOVE) ×1 IMPLANT
GLOVE BIOGEL PI INDICATOR 9 (GLOVE) ×1
GLOVE SURG SYN 9.0  PF PI (GLOVE) ×1
GLOVE SURG SYN 9.0 PF PI (GLOVE) ×1 IMPLANT
GOWN SRG 2XL LVL 4 RGLN SLV (GOWNS) ×1 IMPLANT
GOWN STRL NON-REIN 2XL LVL4 (GOWNS) ×1
GOWN STRL REUS W/ TWL LRG LVL3 (GOWN DISPOSABLE) ×1 IMPLANT
GOWN STRL REUS W/TWL LRG LVL3 (GOWN DISPOSABLE) ×1
GUIDEWIRE 3.2X400 (WIRE) ×1 IMPLANT
KIT TURNOVER KIT A (KITS) ×2 IMPLANT
LONG NAIL TROCAR 8-11MM ×1 IMPLANT
MANIFOLD NEPTUNE II (INSTRUMENTS) ×2 IMPLANT
NAIL TIB TFNA 11X380 (Nail) ×1 IMPLANT
NS IRRIG 1000ML POUR BTL (IV SOLUTION) ×2 IMPLANT
PACK TOTAL KNEE (MISCELLANEOUS) ×2 IMPLANT
PAD ABD DERMACEA PRESS 5X9 (GAUZE/BANDAGES/DRESSINGS) ×1 IMPLANT
PAD CAST CTTN 4X4 STRL (SOFTGOODS) IMPLANT
PADDING CAST COTTON 4X4 STRL (SOFTGOODS) ×1
PROTECTIVE SLEEVE FLEX 8-11MM ×1 IMPLANT
ROD REAM 3.8 BALL TIP 3X1150-S (ORTHOPEDIC DISPOSABLE SUPPLIES) ×1
SCALPEL PROTECTED #15 DISP (BLADE) ×4 IMPLANT
SCHANZ PINS 5.0 ×2 IMPLANT
SCREW LOCK 60X5X IM NL (Screw) IMPLANT
SCREW LOCK IM 44X5X (Screw) IMPLANT
SCREW LOCK IM NAIL 5X44 (Screw) ×1 IMPLANT
SCREW LOCK IM NAIL 5X50 (Screw) ×1 IMPLANT
SCREW LOCK IM NAIL 5X60 (Screw) ×2 IMPLANT
SCREW LOCK IM NL 5X64 (Screw) ×1 IMPLANT
SCREW SHANZ 5X250MM (Screw) ×2 IMPLANT
SLEEVE PROTECTOR FLEX NL 8-11 (ORTHOPEDIC DISPOSABLE SUPPLIES) ×1 IMPLANT
STAPLER SKIN PROX 35W (STAPLE) ×2 IMPLANT
SUT VIC AB 0 CT1 36 (SUTURE) ×2 IMPLANT
SUT VIC AB 2-0 CT1 (SUTURE) ×2 IMPLANT
TROCAR LONG F/NAIL 8-11 STRL (TROCAR) ×1 IMPLANT
WATER STERILE IRR 500ML POUR (IV SOLUTION) ×2 IMPLANT

## 2022-05-10 NOTE — Op Note (Signed)
05/10/2022  2:23 PM  PATIENT:  Dana Bean  66 y.o. 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, subsequent encounter  S82.221K  PROCEDURE: Right tibial nailing for nonunion with fibular osteotomy and temporary placement of femoral distractor SURGEON: Leitha Schuller, MD  ASSISTANTS: None  ANESTHESIA:   general  EBL:  Total I/O In: 750 [I.V.:750] Out: 300 [Blood:300]  BLOOD ADMINISTERED:none  DRAINS: none   LOCAL MEDICATIONS USED:  MARCAINE     SPECIMEN:  No Specimen  DISPOSITION OF SPECIMEN:  N/A  COUNTS:  YES  TOURNIQUET:   Total Tourniquet Time Documented: Thigh (Right) - 6 minutes Total: Thigh (Right) - 6 minutes   IMPLANTS: Synthes tibial nail advanced 11 x 300 mm with multiple proximal interlocking screws  DICTATION: .Dragon Dictation patient was brought to the operating room and after adequate anesthesia was obtained the right leg was prepped and draped in the usual sterile fashion with a tourniquet applied the upper thigh.  C arm was brought in and landmarks identified.  After patient identification and timeout procedures were completed the proximal fibular osteotomy was carried out with a lateral incision with a tourniquet raised.  After a few minutes it was apparent the tourniquet was more of a venous tourniquet was let down there is much less bleeding.  Skin and subcutaneous tissue were divided and muscular fascia incised with the fibula exposed.  Retractors were placed around it and approximately 1 cm section was removed with oblique cuts to allow for subsequent fixation of deformity and to aid in healing of  the tibial nonunion after this bone had been removed the wound was irrigated and closed with 2-0 Vicryl and staples.  Next the AO femoral distractor was applied with a small incision made 2 cm proximal anterior and superior to the calcaneus posterior distal border guide pin inserted a second pin was placed in the anterior medial distal femur but along the area of that had a prior plate.  The distractor was applied and partial correction of the deformity was obtained held in this position.  With C arm to help guide incision a proximal incision was made through the skin and subcutaneous tissue proximal to the patellar pole the quadriceps tendon was split and the soft tissue sleeve for the suprapatellar approach for the tibial nail was placed but with the severe patellofemoral arthritis could not be placed completely down to the anterior tibia so the small sleeve was utilized and guidepin inserted across the nonunion site proximal reaming was carried out plate followed by placement of a long guidewire sequential reaming to 12 mm care being taken not to ream while the reamer was within the knee joint waiting till it was down into the tibial canal measurements were made off the guidewire and after reaming to 12 an 11 x 300 mm rod was inserted down the canal with the leg held in a center anatomic position.  After the rod been inserted the proximal drill guide was applied with 2 lateral to medial interlocking screws 2 oblique screws and 1 anterior to posterior screw with C arm being utilized in both AP and lateral projections to prevent these been going into  the joint or to posterior.  After these been placed the fracture alignment was felt to be acceptable was there was could be compression at the nonunion site with a fibular osteotomy and the wounds were then thoroughly irrigated after removal of the insertion handle permanent C arm views were made proximally and distally no distal interlocks were placed to allow for  compression at the fracture.  The quadriceps tendon was repaired using an 0 Vicryl followed by 2-0 Vicryl subcutaneously and skin staples skin staples for the remaining incisions Xeroform 4 x 4's ABD web roll and Ace wrap supplied  PLAN OF CARE: Admit for overnight observation  PATIENT DISPOSITION:  PACU - hemodynamically stable.

## 2022-05-11 DIAGNOSIS — M170 Bilateral primary osteoarthritis of knee: Secondary | ICD-10-CM | POA: Diagnosis not present

## 2022-05-11 DIAGNOSIS — Z79899 Other long term (current) drug therapy: Secondary | ICD-10-CM | POA: Diagnosis not present

## 2022-05-11 DIAGNOSIS — S82221K Displaced transverse fracture of shaft of right tibia, subsequent encounter for closed fracture with nonunion: Secondary | ICD-10-CM | POA: Diagnosis not present

## 2022-05-11 DIAGNOSIS — Z85830 Personal history of malignant neoplasm of bone: Secondary | ICD-10-CM | POA: Diagnosis not present

## 2022-05-11 DIAGNOSIS — I10 Essential (primary) hypertension: Secondary | ICD-10-CM | POA: Diagnosis not present

## 2022-05-11 DIAGNOSIS — M25562 Pain in left knee: Secondary | ICD-10-CM | POA: Diagnosis not present

## 2022-05-11 DIAGNOSIS — J45909 Unspecified asthma, uncomplicated: Secondary | ICD-10-CM | POA: Diagnosis not present

## 2022-05-11 LAB — CBC
HCT: 31.6 % — ABNORMAL LOW (ref 36.0–46.0)
Hemoglobin: 10.5 g/dL — ABNORMAL LOW (ref 12.0–15.0)
MCH: 31.6 pg (ref 26.0–34.0)
MCHC: 33.2 g/dL (ref 30.0–36.0)
MCV: 95.2 fL (ref 80.0–100.0)
Platelets: 251 10*3/uL (ref 150–400)
RBC: 3.32 MIL/uL — ABNORMAL LOW (ref 3.87–5.11)
RDW: 13.5 % (ref 11.5–15.5)
WBC: 11.5 10*3/uL — ABNORMAL HIGH (ref 4.0–10.5)
nRBC: 0 % (ref 0.0–0.2)

## 2022-05-11 MED ORDER — POLYETHYLENE GLYCOL 3350 17 G PO PACK
17.0000 g | PACK | Freq: Every day | ORAL | Status: DC
  Administered 2022-05-11 – 2022-05-13 (×3): 17 g via ORAL
  Filled 2022-05-11 (×3): qty 1

## 2022-05-11 NOTE — Evaluation (Addendum)
Physical Therapy Evaluation Patient Details Name: Dana Bean MRN: [id number] DOB: 1956-03-23 Today's Date: 05/11/2022  History of Present Illness  Dana Bean is a 66 y.o. female here today for evaluation of bilateral knee pain, left worse than right. She has a history of chondrosarcoma as well as a recent right tibia fracture treated in [place]. She comes in today for evaluation of bilateral knee osteoarthritis. The pt was found to have a closed displaced transverse fx of shaft of right tibia with nonunion. The pt is now s/p R tibial nailing for nonunion with fibular osteotomy and temporary placement of femoral distractor.  Clinical Impression  The pt presents s/p surgical fixation. She continues to present with inability to [facility] through the operative limb and is only able to participate in stand pivot transfers. At this time the pt is WBAT on the RLE but continues to demonstrate w/c level mobility. She would most benefit from further rehabilitation in order to optimize WB through RLE and increase tolerance with gait training.      Recommendations for follow up therapy are one component of a multi-disciplinary discharge planning process, led by the attending physician.  Recommendations may be updated based on patient status, additional functional criteria and insurance authorization.  Follow Up Recommendations Skilled nursing-short term rehab (<3 hours/day) (Pt would best benefit from inpatient rehabilitation in order to progress mobility, but if refusing could d/c home with 24/7 supervision and HHPT.) Can patient physically be transported by private vehicle: Yes    Assistance Recommended at Discharge Intermittent Supervision/Assistance  Patient can return home with the following  Two people to help with walking and/or transfers;A lot of help with bathing/dressing/bathroom;Assistance with cooking/housework;Assist for transportation;Help with stairs or ramp for entrance    Equipment  Recommendations    Recommendations for Other Services       Functional Status Assessment Patient has had a recent decline in their functional status and/or demonstrates limited ability to make significant improvements in function in a reasonable and predictable amount of time     Precautions / Restrictions Precautions Precautions: Fall Restrictions Weight Bearing Restrictions: Yes RLE Weight Bearing: Weight bearing as tolerated      Mobility  Bed Mobility Overal bed mobility: Needs Assistance Bed Mobility: Rolling, Supine to Sit Rolling: Min assist   Supine to sit: Min assist          Transfers Overall transfer level: Needs assistance Equipment used: Rolling walker (2 wheels) Transfers: Bed to chair/wheelchair/BSC, Sit to/from Stand Sit to Stand: Min assist Stand pivot transfers: Min assist, From elevated surface              Ambulation/Gait               General Gait Details: Pt unable to ambulate d/t decreased ability to tolerate WB through the RLE even with UE support  Stairs            Wheelchair Mobility    Modified Rankin (Stroke Patients Only)       Balance Overall balance assessment: Mild deficits observed, not formally tested, Needs assistance Sitting-balance support: Feet supported, Bilateral upper extremity supported Sitting balance-Leahy Scale: Good     Standing balance support: Reliant on assistive device for balance, Bilateral upper extremity supported Standing balance-Leahy Scale: Poor                               Pertinent Vitals/Pain Pain Assessment Pain Assessment: 0-10 Pain  Score: 7  Pain Location: back and bilateral knees Pain Descriptors / Indicators: Discomfort Pain Intervention(s): Limited activity within patient's tolerance    Home Living Family/patient expects to be discharged to:: Private residence Living Arrangements: Spouse/significant other;Non-relatives/Friends Available Help at Discharge:  Family;Friend(s);Available 24 hours/day Type of Home: House         Home Layout: One level Home Equipment: Cane - single Education officer, community (4 wheels) Additional Comments: per the patient she has a RW and 3-in-1 bedside commode coming to her home.    Prior Function Prior Level of Function : Needs assist       Physical Assist : Mobility (physical);ADLs (physical) Mobility (physical): Transfers;Gait;Stairs ADLs (physical): IADLs Mobility Comments: Prior to admission pt utilizing w/c for all mobility. She reports only being able to stand pivot transfer and utilizing a bed pan for toileting.       Hand Dominance   Dominant Hand: Right    Extremity/Trunk Assessment        Lower Extremity Assessment Lower Extremity Assessment: RLE deficits/detail;LLE deficits/detail RLE Deficits / Details: limited R ankle dorsiflexion RLE: Unable to fully assess due to pain RLE Sensation: decreased light touch RLE Coordination: decreased gross motor LLE Deficits / Details: limited L ankle dorsiflexion, generalized weakness on the LLE.    Cervical / Trunk Assessment Cervical / Trunk Assessment: Kyphotic  Communication   Communication: No difficulties  Cognition Arousal/Alertness: Awake/alert Behavior During Therapy: WFL for tasks assessed/performed Overall Cognitive Status: Within Functional Limits for tasks assessed                                          General Comments      Exercises Other Exercises Other Exercises: bed<>chair transfer x2 Other Exercises: sit<>stand x4 Other Exercises: lateral scooting up to the head of the bed   Assessment/Plan    PT Assessment Patient needs continued PT services  PT Problem List Decreased strength;Decreased mobility;Decreased balance;Decreased activity tolerance;Decreased safety awareness       PT Treatment Interventions Gait training;Therapeutic exercise;Therapeutic activities;Functional mobility  training;Neuromuscular re-education;Balance training;Patient/family education    PT Goals (Current goals can be found in the Care Plan section)  Acute Rehab PT Goals Patient Stated Goal: To return home PT Goal Formulation: With patient Time For Goal Achievement: 05/25/22 Potential to Achieve Goals: Good    Frequency 7X/week     Co-evaluation               AM-PAC PT "6 Clicks" Mobility  Outcome Measure Help needed turning from your back to your side while in a flat bed without using bedrails?: A Little Help needed moving from lying on your back to sitting on the side of a flat bed without using bedrails?: A Little Help needed moving to and from a bed to a chair (including a wheelchair)?: A Little Help needed standing up from a chair using your arms (e.g., wheelchair or bedside chair)?: A Little Help needed to walk in hospital room?: Total Help needed climbing 3-5 steps with a railing? : Total 6 Click Score: 14    End of Session   Activity Tolerance: Patient tolerated treatment well Patient left: in bed;with call bell/phone within reach;with bed alarm set Nurse Communication: Mobility status PT Visit Diagnosis: Unsteadiness on feet (R26.81);Muscle weakness (generalized) (M62.81);Difficulty in walking, not elsewhere classified (R26.2);Repeated falls (R29.6)    Time: 9562-1308 PT Time Calculation (min) (ACUTE ONLY): 34  min   Charges:   PT Evaluation $PT Eval Moderate Complexity: 1 Mod PT Treatments $Gait Training: 23-37 mins        12:31 PM, 05/11/22 Flora Parks A. Mordecai Maes PT, DPT Physical Therapist - [facility] A Lovina Zuver 05/11/2022, 12:31 PM

## 2022-05-11 NOTE — TOC Initial Note (Signed)
Transition of Care [facility]) - Initial/Assessment Note    Patient Details  Name: Dana Bean MRN: [id number] Date of Birth: Sep 09, 1956  [facility] CM/SW Contact:    Liliana Cline, LCSW Phone Number: 05/11/2022, 9:27 AM  Clinical Narrative:                 CSW spoke with patient regarding DC planning. Patient lives with her husband. At baseline she drives to appointments. She stated a friend has been taking her.  PCP is Dr. Junious Dresser in [place]. Pharmacy is [facility] in [place] or [facility] order.  Patient stated she is active with [facility] and plans to resume services with them at time of DC. No SNF history. Patient states she has an old walker they found in a building, but needs a new one. She is also agreeable to a 3 in 1. Referral made to [facility] with [employer] for a RW and 3in1 to be delivered to bedside. Patient denies additional needs at this time.   Expected Discharge Plan: Home w Home Health Services Barriers to Discharge: Continued Medical Work up   Patient Goals and CMS Choice Patient states their goals for this hospitalization and ongoing recovery are:: home with home health CMS Medicare.gov Compare Post Acute Care list provided to:: Patient Choice offered to / list presented to : Patient  Expected Discharge Plan and Services Expected Discharge Plan: Home w Home Health Services       Living arrangements for the past 2 months: Single Family Home                 DME Arranged: 3-N-1, Walker rolling DME Agency: [employer] Date DME Agency Contacted: 05/11/22   Representative spoke with at DME Agency: Leavy Cella            Prior Living Arrangements/Services Living arrangements for the past 2 months: Single Family Home Lives with:: Spouse Patient language and need for interpreter reviewed:: Yes Do you feel safe going back to the place where you live?: Yes      Need for Family Participation in Patient Care: Yes (Comment) Care giver  support system in place?: Yes (comment) Current home services: Home PT Criminal Activity/Legal Involvement Pertinent to Current Situation/Hospitalization: No - Comment as needed  Activities of Daily Living Home Assistive Devices/Equipment: Dan Humphreys (specify type) ADL Screening (condition at time of admission) Patient's cognitive ability adequate to safely complete daily activities?: Yes Is the patient deaf or have difficulty hearing?: No Does the patient have difficulty seeing, even when wearing glasses/contacts?: No Does the patient have difficulty concentrating, remembering, or making decisions?: No Patient able to express need for assistance with ADLs?: Yes Does the patient have difficulty dressing or bathing?: No Independently performs ADLs?: Yes (appropriate for developmental age) Does the patient have difficulty walking or climbing stairs?: Yes Weakness of Legs: Both Weakness of Arms/Hands: None  Permission Sought/Granted Permission sought to share information with : Oceanographer granted to share information with : Yes, Verbal Permission Granted     Permission granted to share info w AGENCY: HH and DME agencies        Emotional Assessment       Orientation: : Oriented to Self, Oriented to Place, Oriented to  Time, Oriented to Situation Alcohol / Substance Use: Not Applicable Psych Involvement: No (comment)  Admission diagnosis:  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 [place] [address] Phone: [phone] Fax: [phone]     Social Determinants of Health (SDOH) Interventions    Readmission Risk Interventions     No data to display

## 2022-05-12 DIAGNOSIS — M25562 Pain in left knee: Secondary | ICD-10-CM | POA: Diagnosis not present

## 2022-05-12 DIAGNOSIS — M170 Bilateral primary osteoarthritis of knee: Secondary | ICD-10-CM | POA: Diagnosis not present

## 2022-05-12 DIAGNOSIS — I10 Essential (primary) hypertension: Secondary | ICD-10-CM | POA: Diagnosis not present

## 2022-05-12 DIAGNOSIS — Z79899 Other long term (current) drug therapy: Secondary | ICD-10-CM | POA: Diagnosis not present

## 2022-05-12 DIAGNOSIS — J45909 Unspecified asthma, uncomplicated: Secondary | ICD-10-CM | POA: Diagnosis not present

## 2022-05-12 DIAGNOSIS — S82221K Displaced transverse fracture of shaft of right tibia, subsequent encounter for closed fracture with nonunion: Secondary | ICD-10-CM | POA: Diagnosis not present

## 2022-05-12 DIAGNOSIS — Z85830 Personal history of malignant neoplasm of bone: Secondary | ICD-10-CM | POA: Diagnosis not present

## 2022-05-12 MED ORDER — DOCUSATE SODIUM 100 MG PO CAPS: 100.0000 mg | ORAL_CAPSULE | Freq: Two times a day (BID) | ORAL | 0 refills | Status: DC

## 2022-05-12 MED ORDER — ENOXAPARIN SODIUM 40 MG/0.4ML IJ SOSY: 40.0000 mg | PREFILLED_SYRINGE | INTRAMUSCULAR | 0 refills | Status: DC

## 2022-05-12 MED ORDER — OXYCODONE HCL 5 MG PO TABS: 5.0000 mg | ORAL_TABLET | ORAL | 0 refills | Status: DC | PRN

## 2022-05-13 ENCOUNTER — Encounter: Payer: Self-pay | Admitting: Orthopedic Surgery

## 2022-05-13 DIAGNOSIS — W19XXXA Unspecified fall, initial encounter: Secondary | ICD-10-CM | POA: Diagnosis not present

## 2022-05-13 DIAGNOSIS — S82221K Displaced transverse fracture of shaft of right tibia, subsequent encounter for closed fracture with nonunion: Secondary | ICD-10-CM | POA: Diagnosis not present

## 2022-05-13 DIAGNOSIS — Z7401 Bed confinement status: Secondary | ICD-10-CM | POA: Diagnosis not present

## 2022-05-13 DIAGNOSIS — Z79899 Other long term (current) drug therapy: Secondary | ICD-10-CM | POA: Diagnosis not present

## 2022-05-13 DIAGNOSIS — Z85830 Personal history of malignant neoplasm of bone: Secondary | ICD-10-CM | POA: Diagnosis not present

## 2022-05-13 DIAGNOSIS — E162 Hypoglycemia, unspecified: Secondary | ICD-10-CM | POA: Diagnosis not present

## 2022-05-13 DIAGNOSIS — I959 Hypotension, unspecified: Secondary | ICD-10-CM | POA: Diagnosis not present

## 2022-05-13 DIAGNOSIS — E161 Other hypoglycemia: Secondary | ICD-10-CM | POA: Diagnosis not present

## 2022-05-13 DIAGNOSIS — I10 Essential (primary) hypertension: Secondary | ICD-10-CM | POA: Diagnosis not present

## 2022-05-13 DIAGNOSIS — M170 Bilateral primary osteoarthritis of knee: Secondary | ICD-10-CM | POA: Diagnosis not present

## 2022-05-13 DIAGNOSIS — M25562 Pain in left knee: Secondary | ICD-10-CM | POA: Diagnosis not present

## 2022-05-13 DIAGNOSIS — J45909 Unspecified asthma, uncomplicated: Secondary | ICD-10-CM | POA: Diagnosis not present

## 2022-05-13 MED ORDER — LISINOPRIL 10 MG PO TABS
10.0000 mg | ORAL_TABLET | Freq: Every day | ORAL | Status: DC
Start: 2022-05-14 — End: 2022-05-13

## 2022-05-13 MED ORDER — TRIAMTERENE-HCTZ 37.5-25 MG PO TABS: 1.0000 | ORAL_TABLET | Freq: Every day | ORAL | Status: DC

## 2022-05-14 NOTE — Anesthesia Postprocedure Evaluation (Signed)
Anesthesia Post Note  Patient: Dana Bean  Procedure(s) Performed: Right AO femoral distractore, new tibial nail, attempting to do suprapatellar approach (Right: Leg Upper)  Patient location during evaluation: PACU Anesthesia Type: General Level of consciousness: awake and alert Pain management: pain level controlled Vital Signs Assessment: post-procedure vital signs reviewed and stable Respiratory status: spontaneous breathing, nonlabored ventilation, respiratory function stable and patient connected to nasal cannula oxygen Cardiovascular status: blood pressure returned to baseline and stable Postop Assessment: no apparent nausea or vomiting Anesthetic complications: no   No notable events documented.   Last Vitals:  Vitals:   05/13/22 0757 05/13/22 1222  BP: (!) 117/53 (!) 115/50  Pulse: 70 71  Resp: 18 16  Temp: (!) 36.4 C 36.7 C  SpO2: 98% 98%    Last Pain:  Vitals:   05/13/22 0527  TempSrc: Oral  PainSc:                  Lenard Simmer

## 2022-05-29 ENCOUNTER — Encounter: Payer: Self-pay | Admitting: Orthopedic Surgery

## 2022-06-01 DIAGNOSIS — M79672 Pain in left foot: Secondary | ICD-10-CM | POA: Diagnosis not present

## 2022-06-01 DIAGNOSIS — G8929 Other chronic pain: Secondary | ICD-10-CM | POA: Diagnosis not present

## 2022-06-01 DIAGNOSIS — M25561 Pain in right knee: Secondary | ICD-10-CM | POA: Diagnosis not present

## 2022-06-01 DIAGNOSIS — Z6841 Body Mass Index (BMI) 40.0 and over, adult: Secondary | ICD-10-CM | POA: Diagnosis not present

## 2022-06-01 DIAGNOSIS — I10 Essential (primary) hypertension: Secondary | ICD-10-CM | POA: Diagnosis not present

## 2022-06-01 DIAGNOSIS — M5416 Radiculopathy, lumbar region: Secondary | ICD-10-CM | POA: Diagnosis not present

## 2022-06-01 DIAGNOSIS — M5136 Other intervertebral disc degeneration, lumbar region: Secondary | ICD-10-CM | POA: Diagnosis not present

## 2022-06-01 DIAGNOSIS — M79671 Pain in right foot: Secondary | ICD-10-CM | POA: Diagnosis not present

## 2022-06-01 DIAGNOSIS — M25562 Pain in left knee: Secondary | ICD-10-CM | POA: Diagnosis not present

## 2022-06-05 ENCOUNTER — Other Ambulatory Visit: Payer: Self-pay | Admitting: Orthopedic Surgery

## 2022-06-21 ENCOUNTER — Encounter
Admission: RE | Admit: 2022-06-21 | Discharge: 2022-06-21 | Disposition: A | Discharge status: Home / Self Care | Payer: Medicare HMO | Source: Ambulatory Visit | Attending: Orthopedic Surgery | Admitting: Orthopedic Surgery

## 2022-06-21 DIAGNOSIS — Z01812 Encounter for preprocedural laboratory examination: Secondary | ICD-10-CM | POA: Diagnosis not present

## 2022-06-21 DIAGNOSIS — D649 Anemia, unspecified: Secondary | ICD-10-CM | POA: Diagnosis not present

## 2022-06-21 DIAGNOSIS — S82221K Displaced transverse fracture of shaft of right tibia, subsequent encounter for closed fracture with nonunion: Secondary | ICD-10-CM | POA: Diagnosis not present

## 2022-06-21 LAB — CBC WITH DIFFERENTIAL/PLATELET
Abs Immature Granulocytes: 0.02 10*3/uL (ref 0.00–0.07)
Basophils Absolute: 0.1 10*3/uL (ref 0.0–0.1)
Basophils Relative: 1 %
Eosinophils Absolute: 0.3 10*3/uL (ref 0.0–0.5)
Eosinophils Relative: 4 %
HCT: 35.2 % — ABNORMAL LOW (ref 36.0–46.0)
Hemoglobin: 11.2 g/dL — ABNORMAL LOW (ref 12.0–15.0)
Immature Granulocytes: 0 %
Lymphocytes Relative: 27 %
Lymphs Abs: 1.9 10*3/uL (ref 0.7–4.0)
MCH: 30.3 pg (ref 26.0–34.0)
MCHC: 31.8 g/dL (ref 30.0–36.0)
MCV: 95.1 fL (ref 80.0–100.0)
Monocytes Absolute: 0.4 10*3/uL (ref 0.1–1.0)
Monocytes Relative: 6 %
Neutro Abs: 4.3 10*3/uL (ref 1.7–7.7)
Neutrophils Relative %: 62 %
Platelets: 257 10*3/uL (ref 150–400)
RBC: 3.7 MIL/uL — ABNORMAL LOW (ref 3.87–5.11)
RDW: 13.1 % (ref 11.5–15.5)
WBC: 6.9 10*3/uL (ref 4.0–10.5)
nRBC: 0 % (ref 0.0–0.2)

## 2022-06-21 LAB — SURGICAL PCR SCREEN
MRSA, PCR: NEGATIVE
Staphylococcus aureus: NEGATIVE

## 2022-06-21 LAB — COMPREHENSIVE METABOLIC PANEL
ALT: 17 U/L (ref 0–44)
AST: 21 U/L (ref 15–41)
Albumin: 4.1 g/dL (ref 3.5–5.0)
Alkaline Phosphatase: 127 U/L — ABNORMAL HIGH (ref 38–126)
Anion gap: 8 (ref 5–15)
BUN: 22 mg/dL (ref 8–23)
CO2: 29 mmol/L (ref 22–32)
Calcium: 9.2 mg/dL (ref 8.9–10.3)
Chloride: 104 mmol/L (ref 98–111)
Creatinine, Ser: 0.81 mg/dL (ref 0.44–1.00)
GFR, Estimated: >60 mL/min (ref >60)
Glucose, Bld: 93 mg/dL (ref 70–99)
Potassium: 4.1 mmol/L (ref 3.5–5.1)
Sodium: 141 mmol/L (ref 135–145)
Total Bilirubin: 0.6 mg/dL (ref 0.3–1.2)
Total Protein: 7.1 g/dL (ref 6.5–8.1)

## 2022-06-21 LAB — URINALYSIS, ROUTINE W REFLEX MICROSCOPIC
Bilirubin Urine: NEGATIVE
Glucose, UA: NEGATIVE mg/dL
Hgb urine dipstick: NEGATIVE
Ketones, ur: NEGATIVE mg/dL
Leukocytes,Ua: NEGATIVE
Nitrite: NEGATIVE
Protein, ur: NEGATIVE mg/dL
Specific Gravity, Urine: 1.013 (ref 1.005–1.030)
pH: 8 (ref 5.0–8.0)

## 2022-06-21 LAB — TYPE AND SCREEN
ABO/RH(D): A POS
Antibody Screen: NEGATIVE

## 2022-06-21 NOTE — Patient Instructions (Addendum)
Your procedure is scheduled on: Thursday, August 17 Report to the Registration Desk on the 1st floor of the [facility]. To find out your arrival time, please call [phone] between 1PM - 3PM on: Wednesday, August 16 If your arrival time is 6:00 am, do not arrive prior to that time as the [place] entrance doors do not open until 6:00 am.  REMEMBER: Instructions that are not followed completely may result in serious medical risk, up to and including death; or upon the discretion of your surgeon and anesthesiologist your surgery may need to be rescheduled.  Do not eat food after midnight the night before surgery.  No gum chewing, lozengers or hard candies.  You may however, drink CLEAR liquids up to 2 hours before you are scheduled to arrive for your surgery. Do not drink anything within 2 hours of your scheduled arrival time.  Clear liquids include: - water  - apple juice without pulp - gatorade (not RED colors) - black coffee or tea (Do NOT add milk or creamers to the coffee or tea) Do NOT drink anything that is not on this list.  In addition, your doctor has ordered for you to drink the provided  Ensure Pre-Surgery Clear Carbohydrate Drink  Drinking this carbohydrate drink up to two hours before surgery helps to reduce insulin resistance and improve patient outcomes. Please complete drinking 2 hours prior to scheduled arrival time.  TAKE THESE MEDICATIONS THE MORNING OF SURGERY WITH A SIP OF WATER:  Gabapentin Pantoprazole (Protonix) - (take one the night before and one on the morning of surgery - helps to prevent nausea after surgery.) Symbicort inhaler  Use inhalers on the day of surgery and bring to the hospital.  One week prior to surgery: starting August 10 Stop meloxicam, Anti-inflammatories (NSAIDS) such as Advil, Aleve, Ibuprofen, Motrin, Naproxen, Naprosyn and Aspirin based products such as Excedrin, Goodys Powder, BC Powder. Stop ANY OVER THE COUNTER  supplements until after surgery. Vitamin D, multiple vitamins, fish oil You may however, continue to take Tylenol if needed for pain up until the day of surgery.  No Alcohol for 24 hours before or after surgery.  No Smoking including e-cigarettes for 24 hours prior to surgery.  No chewable tobacco products for at least 6 hours prior to surgery.  No nicotine patches on the day of surgery.  Do not use any "recreational" drugs for at least a week prior to your surgery.  Please be advised that the combination of cocaine and anesthesia may have negative outcomes, up to and including death. If you test positive for cocaine, your surgery will be cancelled.  On the morning of surgery brush your teeth with toothpaste and water, you may rinse your mouth with mouthwash if you wish. Do not swallow any toothpaste or mouthwash.  Use CHG wipes as directed on instruction sheet.  Do not wear jewelry, make-up, hairpins, clips or nail polish.  Do not wear lotions, powders, or perfumes.   Do not shave body from the neck down 48 hours prior to surgery just in case you cut yourself which could leave a site for infection.  Also, freshly shaved skin may become irritated if using the CHG soap.  Do not bring valuables to the hospital. [facility] is not responsible for any missing/lost belongings or valuables.   Notify your doctor if there is any change in your medical condition (cold, fever, infection).  Wear comfortable clothing (specific to your surgery type) to the hospital.  After surgery,  you can help prevent lung complications by doing breathing exercises.  Take deep breaths and cough every 1-2 hours. Your doctor may order a device called an Incentive Spirometer to help you take deep breaths.  If you are being admitted to the hospital overnight, leave your suitcase in the car. After surgery it may be brought to your room.  If you are being discharged the day of surgery, you will not be allowed to  drive home. You will need a responsible adult (18 years or older) to drive you home and stay with you that night.   If you are taking public transportation, you will need to have a responsible adult (18 years or older) with you. Please confirm with your physician that it is acceptable to use public transportation.   Please call the [facility]. at [phone] if you have any questions about these instructions.  Surgery Visitation Policy:  Patients undergoing a surgery or procedure may have two family members or support persons with them as long as the person is not COVID-19 positive or experiencing its symptoms.   Inpatient Visitation:    Visiting hours are 7 a.m. to 8 p.m. Up to four visitors are allowed at one time in a patient room, including children. The visitors may rotate out with other people during the day. One designated support person (adult) may remain overnight.   Preparing the Skin Before Surgery     To help prevent the risk of infection at your surgical site, we are now providing you with rinse-free Sage 2% Chlorhexidine Gluconate (CHG) disposable wipes.  The night before surgery: Shower or bathe with warm water. Do not apply perfume, lotions, powders. Wait one hour after shower. Skin should be dry and cool. Open Sage wipe package - 6 disposable cloths are inside. Wipe body using one cloth for the right arm, one cloth for the left arm, one cloth for the right leg, one cloth for the left leg, one cloth for the chest/abdomen area (do not use on breasts if breast feeding), and one cloth for the back. 5. Do not rinse, allow to dry. 6. Skin may fee "tacky" for several minutes. 7. Dress in clean clothes. 8. Place clean sheets on your bed and do not sleep with pets.  REPEAT ABOVE ON THE MORNING OF SURGERY PRIOR TO ARRIVING TO [place].

## 2022-06-28 DIAGNOSIS — M25562 Pain in left knee: Secondary | ICD-10-CM | POA: Diagnosis not present

## 2022-06-28 DIAGNOSIS — M5416 Radiculopathy, lumbar region: Secondary | ICD-10-CM | POA: Diagnosis not present

## 2022-06-28 DIAGNOSIS — M25561 Pain in right knee: Secondary | ICD-10-CM | POA: Diagnosis not present

## 2022-06-28 DIAGNOSIS — G8929 Other chronic pain: Secondary | ICD-10-CM | POA: Diagnosis not present

## 2022-06-28 DIAGNOSIS — M79671 Pain in right foot: Secondary | ICD-10-CM | POA: Diagnosis not present

## 2022-06-28 DIAGNOSIS — M79672 Pain in left foot: Secondary | ICD-10-CM | POA: Diagnosis not present

## 2022-06-28 DIAGNOSIS — M5136 Other intervertebral disc degeneration, lumbar region: Secondary | ICD-10-CM | POA: Diagnosis not present

## 2022-06-28 DIAGNOSIS — Z79899 Other long term (current) drug therapy: Secondary | ICD-10-CM | POA: Diagnosis not present

## 2022-06-28 DIAGNOSIS — Z6841 Body Mass Index (BMI) 40.0 and over, adult: Secondary | ICD-10-CM | POA: Diagnosis not present

## 2022-06-28 DIAGNOSIS — I10 Essential (primary) hypertension: Secondary | ICD-10-CM | POA: Diagnosis not present

## 2022-06-30 IMAGING — RF DG C-ARM 1-60 MIN-NO REPORT
1 series · 4 of 4 positions shown · non-contrast
Comparison: Right knee radiograph 10/15/2021

CLINICAL DATA: Surgery.

EXAM:
RIGHT TIBIA AND FIBULA - 2 VIEW; DG C-ARM 1-60 MIN-NO REPORT

[Series 1: run · 4 of 4 slices shown]
[im 1/4]
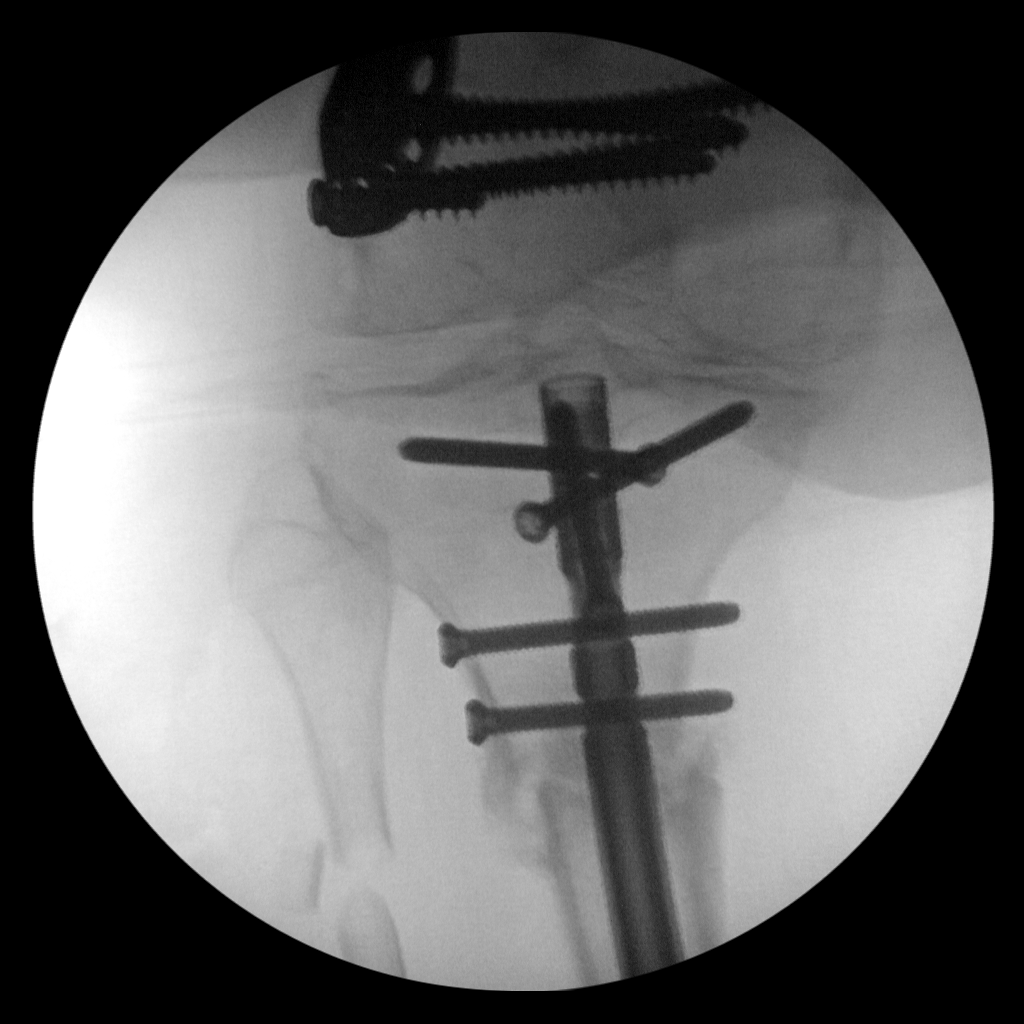
[im 2/4]
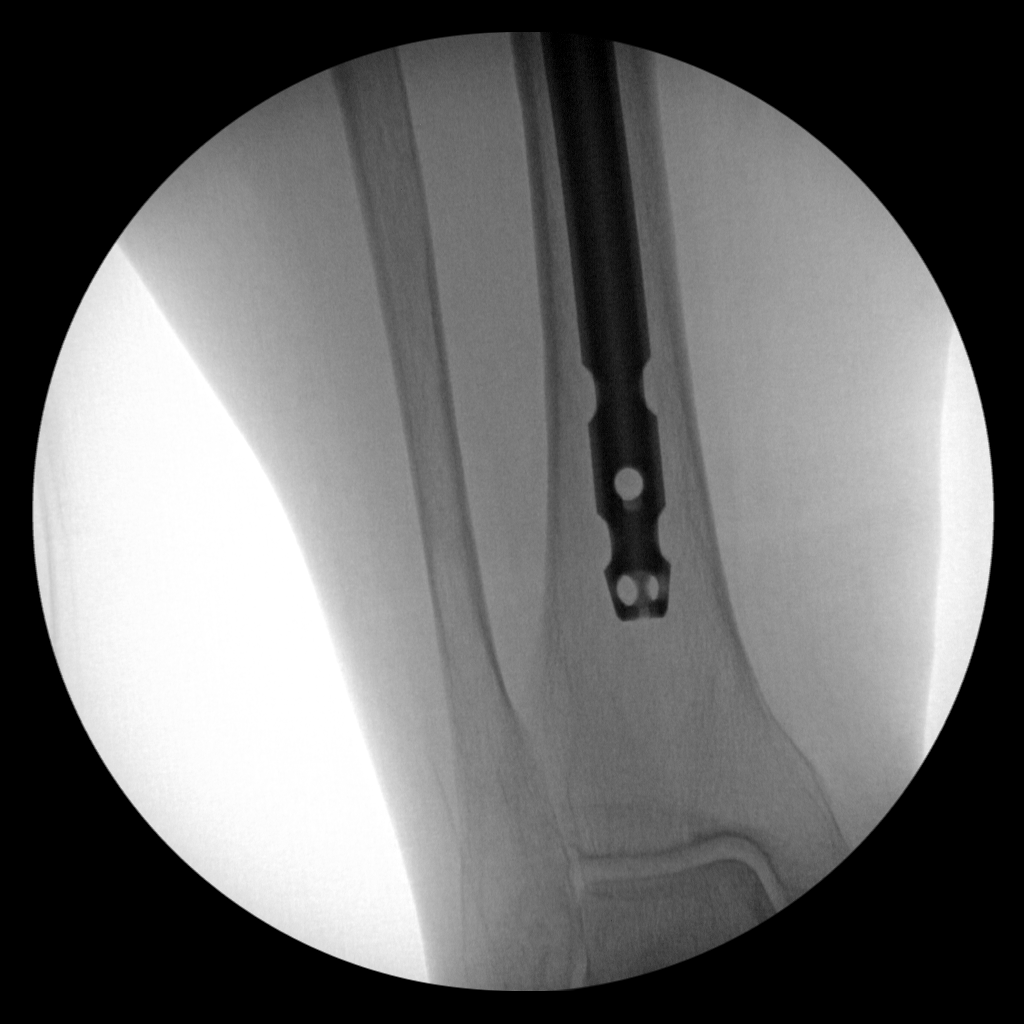
[im 3/4]
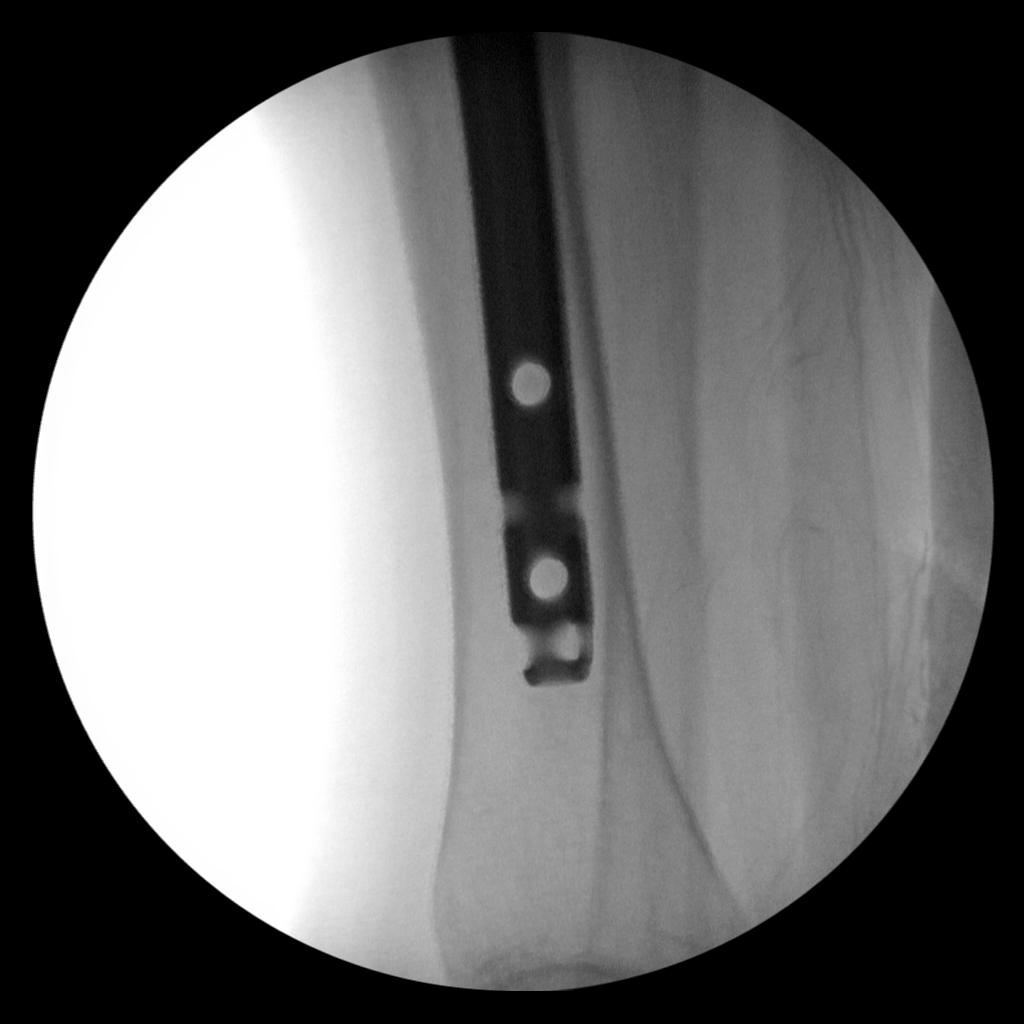
[im 4/4]
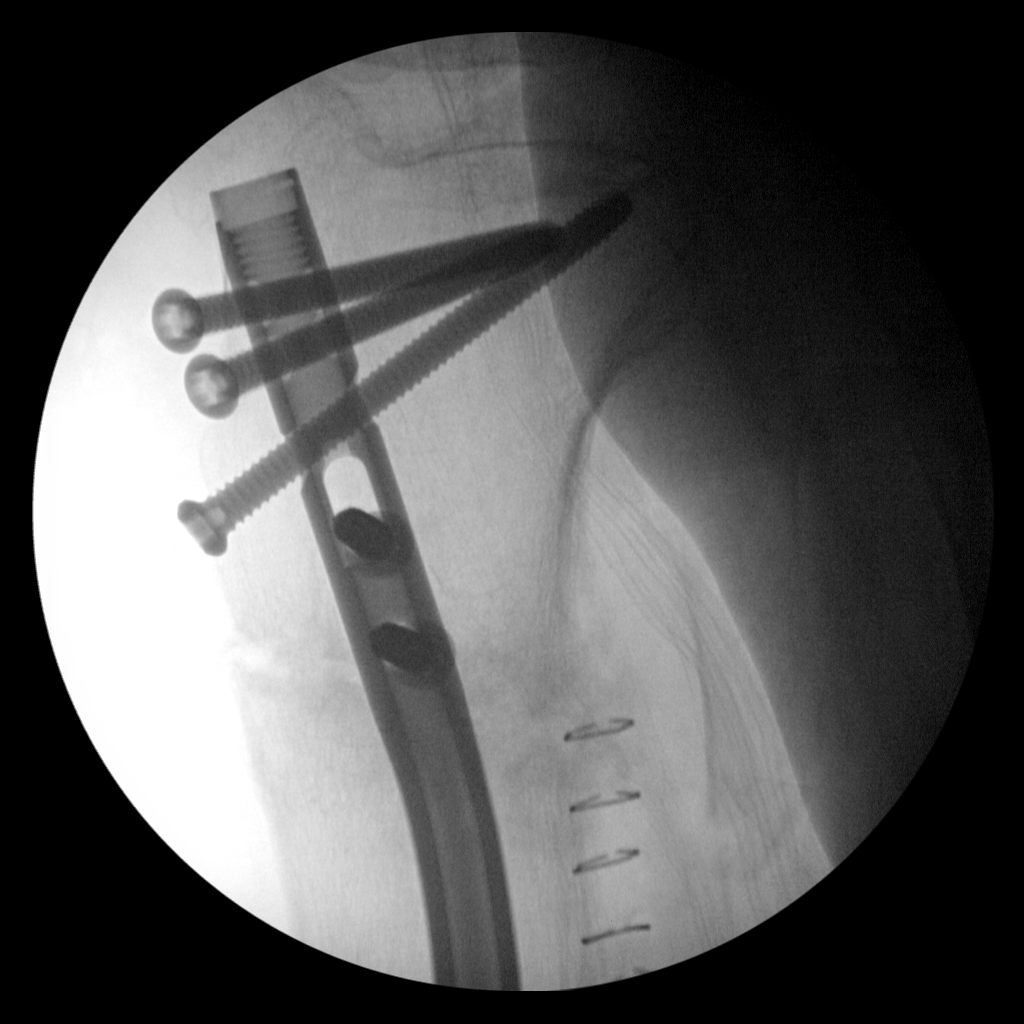

[4 of 4 positions shown; findings below may reference images not displayed]

FINDINGS: Four fluoroscopic spot views of the right lower leg obtained in the
operating room. Proximal tibia and fibular osteotomy or fracture.
Intramedullary nail with proximal screw fixation of the tibia.
Fluoroscopy time 3 minutes 45 seconds. Fluoroscopy dose not
provided.
IMPRESSION: Intraoperative fluoroscopy during right lower leg surgery.

## 2022-07-03 DIAGNOSIS — Z79899 Other long term (current) drug therapy: Secondary | ICD-10-CM | POA: Diagnosis not present

## 2022-07-03 MED ORDER — LACTATED RINGERS IV SOLN: INTRAVENOUS | Status: DC

## 2022-07-03 MED ORDER — ORAL CARE MOUTH RINSE: 15.0000 mL | Freq: Once | OROMUCOSAL | Status: AC

## 2022-07-03 MED ORDER — CHLORHEXIDINE GLUCONATE 0.12 % MT SOLN: 15.0000 mL | Freq: Once | OROMUCOSAL | Status: AC

## 2022-07-03 MED ORDER — CEFAZOLIN IN SODIUM CHLORIDE 3-0.9 GM/100ML-% IV SOLN
3.0000 g | INTRAVENOUS | Status: AC
  Administered 2022-07-04: 3 g via INTRAVENOUS
  Filled 2022-07-03: qty 100

## 2022-07-04 ENCOUNTER — Encounter
Admission: RE | Disposition: A | Discharge status: Home Health | Payer: Self-pay | Source: Home / Self Care | Attending: Orthopedic Surgery

## 2022-07-04 ENCOUNTER — Observation Stay: Payer: Medicare HMO

## 2022-07-04 ENCOUNTER — Ambulatory Visit: Payer: Medicare HMO | Admitting: Urgent Care

## 2022-07-04 ENCOUNTER — Inpatient Hospital Stay
Admission: RE | Admit: 2022-07-04 | Discharge: 2022-07-06 | DRG: 470 | Disposition: A | Discharge status: Home Health | Payer: Medicare HMO | Attending: Orthopedic Surgery | Admitting: Orthopedic Surgery

## 2022-07-04 ENCOUNTER — Other Ambulatory Visit: Payer: Self-pay

## 2022-07-04 ENCOUNTER — Encounter: Payer: Self-pay | Admitting: Orthopedic Surgery

## 2022-07-04 DIAGNOSIS — S82221K Displaced transverse fracture of shaft of right tibia, subsequent encounter for closed fracture with nonunion: Secondary | ICD-10-CM

## 2022-07-04 DIAGNOSIS — X58XXXD Exposure to other specified factors, subsequent encounter: Secondary | ICD-10-CM | POA: Diagnosis present

## 2022-07-04 DIAGNOSIS — Z880 Allergy status to penicillin: Secondary | ICD-10-CM

## 2022-07-04 DIAGNOSIS — Z01812 Encounter for preprocedural laboratory examination: Principal | ICD-10-CM

## 2022-07-04 DIAGNOSIS — K219 Gastro-esophageal reflux disease without esophagitis: Secondary | ICD-10-CM | POA: Diagnosis present

## 2022-07-04 DIAGNOSIS — E785 Hyperlipidemia, unspecified: Secondary | ICD-10-CM | POA: Diagnosis present

## 2022-07-04 DIAGNOSIS — M1712 Unilateral primary osteoarthritis, left knee: Secondary | ICD-10-CM | POA: Diagnosis not present

## 2022-07-04 DIAGNOSIS — G629 Polyneuropathy, unspecified: Secondary | ICD-10-CM | POA: Diagnosis present

## 2022-07-04 DIAGNOSIS — G2581 Restless legs syndrome: Secondary | ICD-10-CM | POA: Diagnosis present

## 2022-07-04 DIAGNOSIS — Z79899 Other long term (current) drug therapy: Secondary | ICD-10-CM

## 2022-07-04 DIAGNOSIS — I10 Essential (primary) hypertension: Secondary | ICD-10-CM | POA: Diagnosis present

## 2022-07-04 DIAGNOSIS — Z6841 Body Mass Index (BMI) 40.0 and over, adult: Secondary | ICD-10-CM

## 2022-07-04 DIAGNOSIS — D62 Acute posthemorrhagic anemia: Secondary | ICD-10-CM | POA: Diagnosis not present

## 2022-07-04 DIAGNOSIS — Z8261 Family history of arthritis: Secondary | ICD-10-CM

## 2022-07-04 DIAGNOSIS — Z96652 Presence of left artificial knee joint: Secondary | ICD-10-CM

## 2022-07-04 DIAGNOSIS — Z85830 Personal history of malignant neoplasm of bone: Secondary | ICD-10-CM | POA: Diagnosis not present

## 2022-07-04 DIAGNOSIS — I959 Hypotension, unspecified: Secondary | ICD-10-CM | POA: Diagnosis not present

## 2022-07-04 DIAGNOSIS — M170 Bilateral primary osteoarthritis of knee: Secondary | ICD-10-CM | POA: Diagnosis present

## 2022-07-04 DIAGNOSIS — Z7401 Bed confinement status: Secondary | ICD-10-CM | POA: Diagnosis not present

## 2022-07-04 DIAGNOSIS — T7840XA Allergy, unspecified, initial encounter: Secondary | ICD-10-CM | POA: Diagnosis not present

## 2022-07-04 DIAGNOSIS — Z90710 Acquired absence of both cervix and uterus: Secondary | ICD-10-CM

## 2022-07-04 DIAGNOSIS — Z471 Aftercare following joint replacement surgery: Secondary | ICD-10-CM | POA: Diagnosis not present

## 2022-07-04 DIAGNOSIS — F32A Depression, unspecified: Secondary | ICD-10-CM | POA: Diagnosis present

## 2022-07-04 HISTORY — PX: TOTAL KNEE ARTHROPLASTY: SHX125

## 2022-07-04 LAB — CREATININE, SERUM
Creatinine, Ser: 0.87 mg/dL (ref 0.44–1.00)
GFR, Estimated: >60 mL/min (ref >60)

## 2022-07-04 LAB — CBC
HCT: 35 % — ABNORMAL LOW (ref 36.0–46.0)
Hemoglobin: 11.3 g/dL — ABNORMAL LOW (ref 12.0–15.0)
MCH: 30.7 pg (ref 26.0–34.0)
MCHC: 32.3 g/dL (ref 30.0–36.0)
MCV: 95.1 fL (ref 80.0–100.0)
Platelets: 269 10*3/uL (ref 150–400)
RBC: 3.68 MIL/uL — ABNORMAL LOW (ref 3.87–5.11)
RDW: 13.6 % (ref 11.5–15.5)
WBC: 13.7 10*3/uL — ABNORMAL HIGH (ref 4.0–10.5)
nRBC: 0 % (ref 0.0–0.2)

## 2022-07-04 SURGERY — ARTHROPLASTY, KNEE, TOTAL
Anesthesia: General | Site: Knee | Laterality: Left

## 2022-07-04 MED ORDER — HYDROMORPHONE HCL 1 MG/ML IJ SOLN
INTRAMUSCULAR | Status: AC
  Filled 2022-07-04: qty 1

## 2022-07-04 MED ORDER — GLYCOPYRROLATE 0.2 MG/ML IJ SOLN
INTRAMUSCULAR | Status: DC | PRN
  Administered 2022-07-04: .2 mg via INTRAVENOUS

## 2022-07-04 MED ORDER — FENTANYL CITRATE (PF) 100 MCG/2ML IJ SOLN
INTRAMUSCULAR | Status: AC
  Filled 2022-07-04: qty 2

## 2022-07-04 MED ORDER — SENNOSIDES-DOCUSATE SODIUM 8.6-50 MG PO TABS: 1.0000 | ORAL_TABLET | Freq: Every evening | ORAL | Status: DC | PRN

## 2022-07-04 MED ORDER — ZOLPIDEM TARTRATE 5 MG PO TABS: 5.0000 mg | ORAL_TABLET | Freq: Every evening | ORAL | Status: DC | PRN

## 2022-07-04 MED ORDER — VITAMIN D 25 MCG (1000 UNIT) PO TABS
1000.0000 [IU] | ORAL_TABLET | Freq: Every day | ORAL | Status: DC
Start: 2022-07-04 — End: 2022-07-06
  Administered 2022-07-04: 1000 [IU] via ORAL
  Filled 2022-07-04: qty 1

## 2022-07-04 MED ORDER — SODIUM CHLORIDE 0.9 % IV SOLN: INTRAVENOUS | Status: DC

## 2022-07-04 MED ORDER — DOCUSATE SODIUM 100 MG PO CAPS
100.0000 mg | ORAL_CAPSULE | Freq: Two times a day (BID) | ORAL | Status: DC
  Filled 2022-07-04 (×3): qty 1

## 2022-07-04 MED ORDER — ROPINIROLE HCL 1 MG PO TABS
1.0000 mg | ORAL_TABLET | Freq: Three times a day (TID) | ORAL | Status: DC
  Administered 2022-07-04 – 2022-07-06 (×6): 1 mg via ORAL
  Filled 2022-07-04 (×6): qty 1

## 2022-07-04 MED ORDER — DIPHENHYDRAMINE HCL 12.5 MG/5ML PO ELIX: 12.5000 mg | ORAL_SOLUTION | ORAL | Status: DC | PRN

## 2022-07-04 MED ORDER — TRIAMTERENE-HCTZ 37.5-25 MG PO TABS
1.0000 | ORAL_TABLET | Freq: Every day | ORAL | Status: DC
Start: 2022-07-04 — End: 2022-07-06
  Administered 2022-07-04: 1 via ORAL
  Filled 2022-07-04 (×3): qty 1

## 2022-07-04 MED ORDER — MAGNESIUM CHLORIDE 64 MG PO TBEC
DELAYED_RELEASE_TABLET | Freq: Every day | ORAL | Status: DC
  Filled 2022-07-04 (×3): qty 1

## 2022-07-04 MED ORDER — LISINOPRIL 10 MG PO TABS
10.0000 mg | ORAL_TABLET | Freq: Every day | ORAL | Status: DC
  Administered 2022-07-04 – 2022-07-05 (×2): 10 mg via ORAL
  Filled 2022-07-04 (×2): qty 1

## 2022-07-04 MED ORDER — BUPIVACAINE HCL (PF) 0.25 % IJ SOLN
INTRAMUSCULAR | Status: AC
  Filled 2022-07-04: qty 60

## 2022-07-04 MED ORDER — MOMETASONE FURO-FORMOTEROL FUM 100-5 MCG/ACT IN AERO
2.0000 | INHALATION_SPRAY | Freq: Two times a day (BID) | RESPIRATORY_TRACT | Status: DC
  Administered 2022-07-04 – 2022-07-05 (×2): 2 via RESPIRATORY_TRACT
  Filled 2022-07-04: qty 8.8

## 2022-07-04 MED ORDER — CEFAZOLIN IN SODIUM CHLORIDE 3-0.9 GM/100ML-% IV SOLN
3.0000 g | Freq: Four times a day (QID) | INTRAVENOUS | Status: AC
  Administered 2022-07-04 (×2): 3 g via INTRAVENOUS
  Filled 2022-07-04 (×2): qty 100

## 2022-07-04 MED ORDER — TRAMADOL HCL 50 MG PO TABS
50.0000 mg | ORAL_TABLET | Freq: Four times a day (QID) | ORAL | Status: DC
  Administered 2022-07-04 – 2022-07-06 (×6): 50 mg via ORAL
  Filled 2022-07-04 (×6): qty 1

## 2022-07-04 MED ORDER — OXYCODONE HCL 5 MG PO TABS: 5.0000 mg | ORAL_TABLET | Freq: Once | ORAL | Status: DC | PRN

## 2022-07-04 MED ORDER — PROPOFOL 10 MG/ML IV BOLUS
INTRAVENOUS | Status: AC
  Filled 2022-07-04: qty 20

## 2022-07-04 MED ORDER — MENTHOL 3 MG MT LOZG: 1.0000 | LOZENGE | OROMUCOSAL | Status: DC | PRN

## 2022-07-04 MED ORDER — DEXTROSE 5 % IV SOLN: INTRAVENOUS | Status: DC | PRN

## 2022-07-04 MED ORDER — ADULT MULTIVITAMIN W/MINERALS CH
1.0000 | ORAL_TABLET | Freq: Every day | ORAL | Status: DC
  Filled 2022-07-04: qty 1

## 2022-07-04 MED ORDER — ERYTHROMYCIN 5 MG/GM OP OINT
1.0000 | TOPICAL_OINTMENT | Freq: Every day | OPHTHALMIC | Status: DC
  Administered 2022-07-04 – 2022-07-05 (×2): 1 via OPHTHALMIC
  Filled 2022-07-04: qty 1

## 2022-07-04 MED ORDER — ACETAMINOPHEN 500 MG PO TABS
1000.0000 mg | ORAL_TABLET | Freq: Four times a day (QID) | ORAL | Status: AC
  Administered 2022-07-04: 1000 mg via ORAL
  Filled 2022-07-04 (×2): qty 2

## 2022-07-04 MED ORDER — ENOXAPARIN SODIUM 30 MG/0.3ML IJ SOSY
30.0000 mg | PREFILLED_SYRINGE | Freq: Two times a day (BID) | INTRAMUSCULAR | Status: DC
  Administered 2022-07-05 – 2022-07-06 (×3): 30 mg via SUBCUTANEOUS
  Filled 2022-07-04 (×3): qty 0.3

## 2022-07-04 MED ORDER — SODIUM CHLORIDE FLUSH 0.9 % IV SOLN
INTRAVENOUS | Status: AC
  Filled 2022-07-04: qty 80

## 2022-07-04 MED ORDER — TRANEXAMIC ACID-NACL 1000-0.7 MG/100ML-% IV SOLN
INTRAVENOUS | Status: DC | PRN
  Administered 2022-07-04: 1000 mg via INTRAVENOUS

## 2022-07-04 MED ORDER — GABAPENTIN 300 MG PO CAPS
300.0000 mg | ORAL_CAPSULE | Freq: Three times a day (TID) | ORAL | Status: DC
  Administered 2022-07-04 – 2022-07-06 (×6): 300 mg via ORAL
  Filled 2022-07-04 (×6): qty 1

## 2022-07-04 MED ORDER — ACETAMINOPHEN 10 MG/ML IV SOLN
INTRAVENOUS | Status: AC
  Filled 2022-07-04: qty 100

## 2022-07-04 MED ORDER — FLEET ENEMA 7-19 GM/118ML RE ENEM
1.0000 | ENEMA | Freq: Once | RECTAL | Status: DC | PRN
Start: 2022-07-04 — End: 2022-07-06

## 2022-07-04 MED ORDER — TRANEXAMIC ACID 1000 MG/10ML IV SOLN
INTRAVENOUS | Status: AC
  Filled 2022-07-04: qty 10

## 2022-07-04 MED ORDER — SODIUM CHLORIDE 0.9 % IR SOLN: Status: DC | PRN

## 2022-07-04 MED ORDER — SERTRALINE HCL 50 MG PO TABS
150.0000 mg | ORAL_TABLET | Freq: Every day | ORAL | Status: DC
  Administered 2022-07-04 – 2022-07-06 (×3): 150 mg via ORAL
  Filled 2022-07-04 (×3): qty 3

## 2022-07-04 MED ORDER — ONDANSETRON HCL 4 MG/2ML IJ SOLN
INTRAMUSCULAR | Status: DC | PRN
  Administered 2022-07-04: 4 mg via INTRAVENOUS

## 2022-07-04 MED ORDER — PROPOFOL 1000 MG/100ML IV EMUL
INTRAVENOUS | Status: AC
  Filled 2022-07-04: qty 100

## 2022-07-04 MED ORDER — HYDROMORPHONE HCL 1 MG/ML IJ SOLN
0.5000 mg | INTRAMUSCULAR | Status: DC | PRN
  Administered 2022-07-04: 0.5 mg via INTRAVENOUS

## 2022-07-04 MED ORDER — BISACODYL 5 MG PO TBEC: 5.0000 mg | DELAYED_RELEASE_TABLET | Freq: Every day | ORAL | Status: DC | PRN

## 2022-07-04 MED ORDER — OXYCODONE HCL 5 MG PO TABS
5.0000 mg | ORAL_TABLET | ORAL | Status: DC | PRN
  Administered 2022-07-05: 5 mg via ORAL
  Filled 2022-07-04: qty 1

## 2022-07-04 MED ORDER — PHENYLEPHRINE HCL (PRESSORS) 10 MG/ML IV SOLN
INTRAVENOUS | Status: AC
Start: 2022-07-04 — End: ?
  Filled 2022-07-04: qty 1

## 2022-07-04 MED ORDER — PROPOFOL 10 MG/ML IV BOLUS
INTRAVENOUS | Status: DC | PRN
  Administered 2022-07-04: 180 mg via INTRAVENOUS

## 2022-07-04 MED ORDER — METOCLOPRAMIDE HCL 5 MG/ML IJ SOLN: 5.0000 mg | Freq: Three times a day (TID) | INTRAMUSCULAR | Status: DC | PRN

## 2022-07-04 MED ORDER — ONDANSETRON HCL 4 MG PO TABS: 4.0000 mg | ORAL_TABLET | Freq: Four times a day (QID) | ORAL | Status: DC | PRN

## 2022-07-04 MED ORDER — BUPIVACAINE-EPINEPHRINE (PF) 0.5% -1:200000 IJ SOLN
INTRAMUSCULAR | Status: AC
Start: 2022-07-04 — End: ?
  Filled 2022-07-04: qty 60

## 2022-07-04 MED ORDER — LACTATED RINGERS IV SOLN: INTRAVENOUS | Status: DC | PRN

## 2022-07-04 MED ORDER — HYDROMORPHONE HCL 1 MG/ML IJ SOLN
0.5000 mg | INTRAMUSCULAR | Status: DC | PRN
  Administered 2022-07-04: 1 mg via INTRAVENOUS
  Administered 2022-07-05: 0.5 mg via INTRAVENOUS
  Filled 2022-07-04 (×2): qty 1

## 2022-07-04 MED ORDER — SUGAMMADEX SODIUM 200 MG/2ML IV SOLN
INTRAVENOUS | Status: DC | PRN
  Administered 2022-07-04 (×2): 100 mg via INTRAVENOUS

## 2022-07-04 MED ORDER — FENTANYL CITRATE (PF) 100 MCG/2ML IJ SOLN
25.0000 ug | INTRAMUSCULAR | Status: DC | PRN
  Administered 2022-07-04: 50 ug via INTRAVENOUS
  Administered 2022-07-04 (×2): 25 ug via INTRAVENOUS

## 2022-07-04 MED ORDER — OXYCODONE HCL 5 MG/5ML PO SOLN: 5.0000 mg | Freq: Once | ORAL | Status: DC | PRN

## 2022-07-04 MED ORDER — SURGIPHOR WOUND IRRIGATION SYSTEM - OPTIME: TOPICAL | Status: DC | PRN

## 2022-07-04 MED ORDER — PHENYLEPHRINE HCL (PRESSORS) 10 MG/ML IV SOLN
INTRAVENOUS | Status: AC
  Filled 2022-07-04: qty 1

## 2022-07-04 MED ORDER — DEXMEDETOMIDINE HCL IN NACL 200 MCG/50ML IV SOLN
INTRAVENOUS | Status: DC | PRN
  Administered 2022-07-04: .4 ug/kg/h via INTRAVENOUS
  Administered 2022-07-04: 4 ug via INTRAVENOUS

## 2022-07-04 MED ORDER — ALUM & MAG HYDROXIDE-SIMETH 200-200-20 MG/5ML PO SUSP: 30.0000 mL | ORAL | Status: DC | PRN

## 2022-07-04 MED ORDER — PRAVASTATIN SODIUM 20 MG PO TABS
20.0000 mg | ORAL_TABLET | Freq: Every day | ORAL | Status: DC
  Administered 2022-07-04 – 2022-07-05 (×2): 20 mg via ORAL
  Filled 2022-07-04 (×2): qty 1

## 2022-07-04 MED ORDER — SODIUM CHLORIDE (PF) 0.9 % IJ SOLN
INTRAMUSCULAR | Status: DC | PRN
  Administered 2022-07-04: 91 mL via INTRAMUSCULAR

## 2022-07-04 MED ORDER — MAGNESIUM HYDROXIDE 400 MG/5ML PO SUSP
30.0000 mL | Freq: Every day | ORAL | Status: DC
  Filled 2022-07-04: qty 30

## 2022-07-04 MED ORDER — MIDAZOLAM HCL 2 MG/2ML IJ SOLN
INTRAMUSCULAR | Status: DC | PRN
  Administered 2022-07-04: 2 mg via INTRAVENOUS

## 2022-07-04 MED ORDER — MORPHINE SULFATE (PF) 10 MG/ML IV SOLN
INTRAVENOUS | Status: AC
  Filled 2022-07-04: qty 1

## 2022-07-04 MED ORDER — ACETAMINOPHEN 10 MG/ML IV SOLN
INTRAVENOUS | Status: DC | PRN
  Administered 2022-07-04: 1000 mg via INTRAVENOUS

## 2022-07-04 MED ORDER — MIDAZOLAM HCL 2 MG/2ML IJ SOLN
INTRAMUSCULAR | Status: AC
  Filled 2022-07-04: qty 2

## 2022-07-04 MED ORDER — TIZANIDINE HCL 4 MG PO TABS: 4.0000 mg | ORAL_TABLET | Freq: Three times a day (TID) | ORAL | Status: DC | PRN

## 2022-07-04 MED ORDER — OXYCODONE HCL ER 10 MG PO T12A
10.0000 mg | EXTENDED_RELEASE_TABLET | Freq: Four times a day (QID) | ORAL | Status: DC
  Administered 2022-07-04 – 2022-07-06 (×7): 10 mg via ORAL
  Filled 2022-07-04 (×7): qty 1

## 2022-07-04 MED ORDER — HYDROMORPHONE HCL 1 MG/ML IJ SOLN
INTRAMUSCULAR | Status: DC | PRN
  Administered 2022-07-04: 1 mg via INTRAVENOUS
  Administered 2022-07-04 (×2): .5 mg via INTRAVENOUS

## 2022-07-04 MED ORDER — ROCURONIUM BROMIDE 100 MG/10ML IV SOLN
INTRAVENOUS | Status: DC | PRN
  Administered 2022-07-04: 50 mg via INTRAVENOUS
  Administered 2022-07-04 (×2): 10 mg via INTRAVENOUS

## 2022-07-04 MED ORDER — FENTANYL CITRATE (PF) 100 MCG/2ML IJ SOLN
INTRAMUSCULAR | Status: DC | PRN
  Administered 2022-07-04 (×4): 50 ug via INTRAVENOUS

## 2022-07-04 MED ORDER — DEXAMETHASONE SODIUM PHOSPHATE 10 MG/ML IJ SOLN
INTRAMUSCULAR | Status: DC | PRN
  Administered 2022-07-04: 10 mg via INTRAVENOUS

## 2022-07-04 MED ORDER — FENTANYL CITRATE (PF) 100 MCG/2ML IJ SOLN
INTRAMUSCULAR | Status: AC
  Administered 2022-07-04: 50 ug via INTRAVENOUS
  Filled 2022-07-04: qty 2

## 2022-07-04 MED ORDER — ONDANSETRON HCL 4 MG/2ML IJ SOLN: 4.0000 mg | Freq: Four times a day (QID) | INTRAMUSCULAR | Status: DC | PRN

## 2022-07-04 MED ORDER — LIDOCAINE HCL (CARDIAC) PF 100 MG/5ML IV SOSY
PREFILLED_SYRINGE | INTRAVENOUS | Status: DC | PRN
  Administered 2022-07-04: 100 mg via INTRAVENOUS

## 2022-07-04 MED ORDER — CHLORHEXIDINE GLUCONATE 0.12 % MT SOLN
OROMUCOSAL | Status: AC
  Administered 2022-07-04: 15 mL via OROMUCOSAL
  Filled 2022-07-04: qty 15

## 2022-07-04 MED ORDER — HYDROMORPHONE HCL 1 MG/ML IJ SOLN
INTRAMUSCULAR | Status: AC
  Administered 2022-07-04: 0.5 mg via INTRAVENOUS
  Filled 2022-07-04: qty 1

## 2022-07-04 MED ORDER — OXYCODONE HCL 5 MG PO TABS
10.0000 mg | ORAL_TABLET | ORAL | Status: DC | PRN
  Administered 2022-07-05: 15 mg via ORAL
  Filled 2022-07-04: qty 3

## 2022-07-04 MED ORDER — BUPIVACAINE LIPOSOME 1.3 % IJ SUSP
INTRAMUSCULAR | Status: AC
  Filled 2022-07-04: qty 40

## 2022-07-04 MED ORDER — PHENOL 1.4 % MT LIQD: 1.0000 | OROMUCOSAL | Status: DC | PRN

## 2022-07-04 MED ORDER — PHENYLEPHRINE HCL-NACL 20-0.9 MG/250ML-% IV SOLN
INTRAVENOUS | Status: DC | PRN
  Administered 2022-07-04 (×2): 80 ug via INTRAVENOUS
  Administered 2022-07-04: 40 ug/min via INTRAVENOUS

## 2022-07-04 MED ORDER — ACETAMINOPHEN 325 MG PO TABS: 325.0000 mg | ORAL_TABLET | Freq: Four times a day (QID) | ORAL | Status: DC | PRN

## 2022-07-04 MED ORDER — PANTOPRAZOLE SODIUM 40 MG PO TBEC
40.0000 mg | DELAYED_RELEASE_TABLET | Freq: Every day | ORAL | Status: DC
  Administered 2022-07-04 – 2022-07-06 (×3): 40 mg via ORAL
  Filled 2022-07-04 (×3): qty 1

## 2022-07-04 MED ORDER — METOCLOPRAMIDE HCL 5 MG PO TABS: 5.0000 mg | ORAL_TABLET | Freq: Three times a day (TID) | ORAL | Status: DC | PRN

## 2022-07-04 SURGICAL SUPPLY — 67 items
BLADE SAGITTAL 25.0X1.19X90 (BLADE) ×2 IMPLANT
BLADE SAW 90X13X1.19 OSCILLAT (BLADE) ×2 IMPLANT
BNDG ELASTIC 6X5.8 VLCR STR LF (GAUZE/BANDAGES/DRESSINGS) ×2 IMPLANT
CANISTER WOUND CARE 500ML ATS (WOUND CARE) ×2 IMPLANT
CEMENT HV SMART SET (Cement) ×4 IMPLANT
CHLORAPREP W/TINT 26 (MISCELLANEOUS) ×4 IMPLANT
COOLER POLAR GLACIER W/PUMP (MISCELLANEOUS) ×2 IMPLANT
CUFF TOURN SGL QUICK 24 (TOURNIQUET CUFF)
CUFF TOURN SGL QUICK 34 (TOURNIQUET CUFF)
CUFF TRNQT CYL 24X4X16.5-23 (TOURNIQUET CUFF) IMPLANT
CUFF TRNQT CYL 34X4.125X (TOURNIQUET CUFF) IMPLANT
DRAPE 3/4 80X56 (DRAPES) ×4 IMPLANT
DRSG MEPILEX SACRM 8.7X9.8 (GAUZE/BANDAGES/DRESSINGS) ×1 IMPLANT
ELECT CAUTERY BLADE 6.4 (BLADE) ×2 IMPLANT
ELECT REM PT RETURN 9FT ADLT (ELECTROSURGICAL) ×2
ELECTRODE REM PT RTRN 9FT ADLT (ELECTROSURGICAL) ×1 IMPLANT
FEM COMP 5 LT CEMENT 02120005L (Femur) ×1 IMPLANT
GAUZE 4X4 16PLY ~~LOC~~+RFID DBL (SPONGE) ×2 IMPLANT
GAUZE XEROFORM 1X8 LF (GAUZE/BANDAGES/DRESSINGS) ×1 IMPLANT
GLOVE BIOGEL PI IND STRL 9 (GLOVE) ×1 IMPLANT
GLOVE BIOGEL PI INDICATOR 9 (GLOVE) ×2
GLOVE SURG ORTHO 8.0 STRL STRW (GLOVE) ×2 IMPLANT
GLOVE SURG SYN 9.0  PF PI (GLOVE) ×2
GLOVE SURG SYN 9.0 PF PI (GLOVE) ×1 IMPLANT
GLOVE SURG UNDER LTX SZ8 (GLOVE) ×2 IMPLANT
GOWN SRG 2XL LVL 4 RGLN SLV (GOWNS) ×1 IMPLANT
GOWN STRL NON-REIN 2XL LVL4 (GOWNS) ×2
GOWN STRL REUS W/ TWL LRG LVL3 (GOWN DISPOSABLE) ×1 IMPLANT
GOWN STRL REUS W/ TWL XL LVL3 (GOWN DISPOSABLE) ×1 IMPLANT
GOWN STRL REUS W/TWL LRG LVL3 (GOWN DISPOSABLE) ×2
GOWN STRL REUS W/TWL XL LVL3 (GOWN DISPOSABLE) ×2
HOLDER FOLEY CATH W/STRAP (MISCELLANEOUS) ×1 IMPLANT
HOOD PEEL AWAY FLYTE STAYCOOL (MISCELLANEOUS) ×4 IMPLANT
INSERT TIBIAL FIXED SZ5 LT 12M (Insert) ×1 IMPLANT
IV NS IRRIG 3000ML ARTHROMATIC (IV SOLUTION) ×2 IMPLANT
KIT PREVENA INCISION MGT20CM45 (CANNISTER) ×2 IMPLANT
KIT TURNOVER KIT A (KITS) ×2 IMPLANT
MANIFOLD NEPTUNE II (INSTRUMENTS) ×4 IMPLANT
NDL SAFETY ECLIPSE 18X1.5 (NEEDLE) ×1 IMPLANT
NDL SPNL 20GX3.5 QUINCKE YW (NEEDLE) ×1 IMPLANT
NEEDLE HYPO 18GX1.5 SHARP (NEEDLE) ×2
NEEDLE SPNL 20GX3.5 QUINCKE YW (NEEDLE) ×2 IMPLANT
NS IRRIG 1000ML POUR BTL (IV SOLUTION) ×2 IMPLANT
PACK TOTAL KNEE (MISCELLANEOUS) ×2 IMPLANT
PAD WRAPON POLAR KNEE (MISCELLANEOUS) ×1 IMPLANT
PATELLA RESURFACING MEDACTA 02 (Bone Implant) ×1 IMPLANT
PULSAVAC PLUS IRRIG FAN TIP (DISPOSABLE) ×2
SCALPEL PROTECTED #10 DISP (BLADE) ×4 IMPLANT
SOLUTION IRRIG SURGIPHOR (IV SOLUTION) ×2 IMPLANT
STAPLER SKIN PROX 35W (STAPLE) ×2 IMPLANT
STEM EXTENSION 11MMX30MM (Stem) ×1 IMPLANT
SUCTION FRAZIER HANDLE 10FR (MISCELLANEOUS) ×2
SUCTION TUBE FRAZIER 10FR DISP (MISCELLANEOUS) ×1 IMPLANT
SUT DVC 2 QUILL PDO  T11 36X36 (SUTURE) ×2
SUT DVC 2 QUILL PDO T11 36X36 (SUTURE) ×1 IMPLANT
SUT ETHIBOND 2 V 37 (SUTURE) IMPLANT
SUT V-LOC 90 ABS DVC 3-0 CL (SUTURE) ×2 IMPLANT
SYR 20ML LL LF (SYRINGE) ×2 IMPLANT
SYR 50ML LL SCALE MARK (SYRINGE) ×4 IMPLANT
TIB TRAY FIXED SZ5 L (Joint) ×1 IMPLANT
TIP FAN IRRIG PULSAVAC PLUS (DISPOSABLE) ×1 IMPLANT
TOWEL OR 17X26 4PK STRL BLUE (TOWEL DISPOSABLE) ×1 IMPLANT
TOWER CARTRIDGE SMART MIX (DISPOSABLE) ×2 IMPLANT
TRAP FLUID SMOKE EVACUATOR (MISCELLANEOUS) ×2 IMPLANT
TRAY FOLEY MTR SLVR 16FR STAT (SET/KITS/TRAYS/PACK) ×1 IMPLANT
WATER STERILE IRR 1000ML POUR (IV SOLUTION) ×2 IMPLANT
WRAPON POLAR PAD KNEE (MISCELLANEOUS) ×2

## 2022-07-04 NOTE — Plan of Care (Signed)

## 2022-07-04 NOTE — Anesthesia Procedure Notes (Signed)
Procedure Name: Intubation Date/Time: 07/04/2022 7:36 AM  Performed by: Gayland Curry, CRNAPre-anesthesia Checklist: Patient identified, Emergency Drugs available, Suction available and Patient being monitored Patient Re-evaluated:Patient Re-evaluated prior to induction Oxygen Delivery Method: Circle system utilized Preoxygenation: Pre-oxygenation with 100% oxygen Induction Type: IV induction Ventilation: Mask ventilation without difficulty and Oral airway inserted - appropriate to patient size Laryngoscope Size: McGraph and 4 Tube type: Oral Tube size: 7.0 mm Number of attempts: 1 Airway Equipment and Method: Stylet

## 2022-07-04 NOTE — Anesthesia Postprocedure Evaluation (Signed)
Anesthesia Post Note  Patient: Dana Bean  Procedure(s) Performed: TOTAL KNEE ARTHROPLASTY (Left: Knee)  Patient location during evaluation: PACU Anesthesia Type: General Level of consciousness: awake and alert Pain management: pain level controlled Vital Signs Assessment: post-procedure vital signs reviewed and stable Respiratory status: spontaneous breathing, nonlabored ventilation, respiratory function stable and patient connected to nasal cannula oxygen Cardiovascular status: blood pressure returned to baseline and stable Postop Assessment: no apparent nausea or vomiting Anesthetic complications: no   No notable events documented.   Last Vitals:  Vitals:   07/04/22 1115 07/04/22 1143  BP: 112/70 (!) 133/59  Pulse:  66  Resp:  16  Temp: (!) 36.3 C 36.7 C  SpO2:  98%    Last Pain:  Vitals:   07/04/22 1115  TempSrc:   PainSc: 5                  Precious Haws Fender Herder

## 2022-07-04 NOTE — Anesthesia Preprocedure Evaluation (Addendum)
Anesthesia Evaluation  Patient identified by MRN, date of birth, ID band Patient awake    Reviewed: Allergy & Precautions, NPO status , Patient's Chart, lab work & pertinent test results  History of Anesthesia Complications Negative for: history of anesthetic complications  Airway Mallampati: III  TM Distance: <3 FB Neck ROM: full    Dental  (+) Chipped   Pulmonary neg shortness of breath, asthma ,    Pulmonary exam normal        Cardiovascular Exercise Tolerance: Good hypertension, (-) angina(-) Past MI Normal cardiovascular exam     Neuro/Psych PSYCHIATRIC DISORDERS  Neuromuscular disease    GI/Hepatic Neg liver ROS, GERD  Controlled,  Endo/Other  negative endocrine ROS  Renal/GU      Musculoskeletal  (+) Arthritis ,   Abdominal   Peds  Hematology negative hematology ROS (+)   Anesthesia Other Findings Past Medical History: No date: Asthma due to environmental allergies No date: 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 date: 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: Closed fracture of shaft of right tibia No date: Depression No date: GERD (gastroesophageal reflux disease) No date: Hyperlipidemia No date: Hypertension No date: Osteoarthritis No date: Peripheral neuropathy No date: Restless leg syndrome  Past Surgical History: No date: INGUINAL HERNIA REPAIR; Left No date: INGUINAL HERNIA REPAIR; Right 2004: KNEE ARTHROSCOPY; Left     Comment:  torn meniscus 05/10/2022: TIBIA IM NAIL INSERTION; Right     Comment:  Procedure: Right AO femoral distractore, new tibial               nail, attempting to do suprapatellar approach;  Surgeon:               Hessie Knows, MD;  Location: [facility] ORS;  Service:               Orthopedics;  Laterality: Right; No date: TONSILLECTOMY No date: VAGINAL HYSTERECTOMY  BMI    Body Mass Index: 49.78 kg/m      Reproductive/Obstetrics negative OB ROS                             Anesthesia Physical Anesthesia Plan  ASA: 3  Anesthesia Plan: General ETT   Post-op Pain Management:    Induction: Intravenous  PONV Risk Score and Plan: Ondansetron, Dexamethasone, Midazolam and Treatment may vary due to age or medical condition  Airway Management Planned: Oral ETT  Additional Equipment:   Intra-op Plan:   Post-operative Plan: Extubation in OR  Informed Consent: I have reviewed the patients History and Physical, chart, labs and discussed the procedure including the risks, benefits and alternatives for the proposed anesthesia with the patient or authorized representative who has indicated his/her understanding and acceptance.     Dental Advisory Given  Plan Discussed with: Anesthesiologist, CRNA and Surgeon  Anesthesia Plan Comments: (Patient reports history of back problems, declines spinal and request GA  Patient consented for risks of anesthesia including but not limited to:  - adverse reactions to medications - damage to eyes, teeth, lips or other oral mucosa - nerve damage due to positioning  - sore throat or hoarseness - Damage to heart, brain, nerves, lungs, other parts of body or loss of life  Patient voiced understanding.)       Anesthesia Quick Evaluation

## 2022-07-04 NOTE — H&P (Signed)
Chief Complaint  Patient presents with  Left Knee - Pain    History of the Present Illness: Dana Bean is a 66 y.o. female here today follow-up evaluation status post right tibia/fibula fracture.  She also is here for H and P for left total knee arthroplasty. X-ray obtained today. She had a partial fibulectomy to allow for compression.  She needs to do in-home physical therapy..   I have reviewed past medical, surgical, social and family history, and allergies as documented in the EMR.  Past Medical History: Past Medical History:  Diagnosis Date  Chondrosarcoma (CMS-HCC)  Chondrosarcoma of bone (CMS-HCC)  Depression  Hyperlipidemia  Hypertension  Osteoarthritis  Peripheral neuropathy  Restless leg syndrome   Past Surgical History: Past Surgical History:  Procedure Laterality Date  AO Femoral distractore Right 05/10/2022  By Dr. Rudene Christians  HERNIA REPAIR  HYSTERECTOMY  KNEE ARTHROSCOPY Right   Past Family History: Family History  Problem Relation Age of Onset  Rheum arthritis Sister  Crohn's disease Sister   Medications: Current Outpatient Medications Ordered in Epic  Medication Sig Dispense Refill  budesonide-formoteroL (SYMBICORT) 80-4.5 mcg/actuation inhaler Inhale 2 inhalations into the lungs once as needed  cholecalciferol (VITAMIN D3) 1000 unit capsule Take 1,000 Units by mouth once daily  clotrimazole-betamethasone (LOTRISONE) 1-0.05 % cream Apply topically once as needed  erythromycin (ROMYCIN) ophthalmic ointment Place into both eyes at bedtime  gabapentin (NEURONTIN) 300 MG capsule Take 300 mg by mouth 3 (three) times daily  lisinopriL (ZESTRIL) 10 MG tablet Take 10 mg by mouth once daily  meloxicam (MOBIC) 15 MG tablet Take 15 mg by mouth once daily  multivitamin tablet Take 1 tablet by mouth once daily  oxyCODONE (OXYCONTIN) 10 MG CR tablet Take 10 mg by mouth every 12 (twelve) hours  pantoprazole (PROTONIX) 40 MG DR tablet Take 40 mg by mouth once daily   pravastatin (PRAVACHOL) 20 MG tablet Take 20 mg by mouth at bedtime  rOPINIRole (REQUIP) 1 MG tablet Take 1 mg by mouth 2 (two) times daily  sertraline (ZOLOFT) 50 MG tablet Take 50 mg by mouth once daily Takes 3 tabs daily 150 mg  tiZANidine (ZANAFLEX) 4 MG capsule Take 4 mg by mouth as needed for Muscle spasms  triamterene-hydroCHLOROthiazide (MAXZIDE-25) 37.5-25 mg tablet Take 1 tablet by mouth once daily   No current Epic-ordered facility-administered medications on file.   Allergies: Allergies  Allergen Reactions  Augmentin [Amoxicillin-Pot Clavulanate] Unknown    Body mass index is 47.83 kg/m.  Review of Systems: A comprehensive 14 point ROS was performed, reviewed, and the pertinent orthopaedic findings are documented in the HPI.  Vitals:  06/21/22 0933  BP: 136/78    General Physical Examination:   General/Constitutional: No apparent distress: well-nourished and well developed. Eyes: Pupils equal, round with synchronous movement. Lungs: Clear to auscultation HEENT: Normal Vascular: No edema, swelling or tenderness, except as noted in detailed exam. Cardiac: Heart rate and rhythm is regular. Integumentary: No impressive skin lesions present, except as noted in detailed exam. Neuro/Psych: Normal mood and affect, oriented to person, place, and time.  On exam, left knee range of motion of 15 to 85 degrees. On exam, right knee, there are some absorbable sutures that have been absorbed on the lateral fibular incision.  Radiographs:  AP and lateral x-rays of the right tibia and fibula were ordered and personally reviewed today. These show multiple screws proximally. There is a good amount of callus present. There are some signs of healing with normal alignment  of the leg.  Assessment: ICD-10-CM  1. 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 Primary osteoarthritis of both knees M17.0   Plan:  The  patient has clinical findings of severe left knee osteoarthritis with marked varus deformity.  We discussed the patient's x-ray findings. I explained she has some signs of healing. We will plan for stemmed implants. Additionally, her right tibia is showing signs of healing. She is weight-bearing as tolerated on that leg. We will schedule the patient for left total knee arthroplasty in approximately 2.5 weeks.  Surgical Risks:  The nature of the condition and the proposed procedure has been reviewed in detail with the patient. Surgical versus non-surgical options and prognosis for recovery have been reviewed and the inherent risks and benefits of each have been discussed including the risks of infection, bleeding, injury to nerves/blood vessels/tendons, incomplete relief of symptoms, persisting pain and/or stiffness, loss of function, complex regional pain syndrome, failure of the procedure, as appropriate.  Document Attestation: I, [place], have reviewed and updated documentation for [employer], MD, utilizing Nuance DAX.    Electronically signed by Lauris Poag, MD at 06/22/2022 6:58 AM EDT Reviewed  H+P. No changes noted.

## 2022-07-04 NOTE — Op Note (Signed)
07/04/2022  10:03 AM  PATIENT:  Dana Bean   MRN: [id number]  PRE-OPERATIVE DIAGNOSIS:  Primary localized osteoarthritis of left knee   POST-OPERATIVE DIAGNOSIS:  Same   PROCEDURE:  Procedure(s): Left TOTAL KNEE ARTHROPLASTY   SURGEON: Laurene Footman, MD   ASSISTANTS: Rachelle Hora PA-C   ANESTHESIA:   general   EBL: 200   BLOOD ADMINISTERED:none   DRAINS:  Incisional wound VAC     LOCAL MEDICATIONS USED:  MARCAINE    and OTHER Exparel morphine   SPECIMEN:  No Specimen   DISPOSITION OF SPECIMEN:  N/A   COUNTS:  YES   TOURNIQUET: 83 minutes at 300 mm Hg   IMPLANTS: Medacta  GMK sphere system with 5 left femur, 5 left tibia with short stem and 4 routine mm insert.  Size 2 patella, all components cemented.   DICTATION: Viviann Spare Dictation   patient was brought to the operating room and general normal anesthesia was obtained.  After prepping and draping the left leg in sterile fashion, and after patient identification and timeout procedures were completed, tourniquet was raised  and midline skin incision was made followed by medial parapatellar arthrotomy with severe medial compartment osteoarthritis, severe patellofemoral arthritis and severe lateral compartment arthritis, partial synovectomy was also carried out.   The ACL and PCL and fat pad were excised along with anterior horns of the meniscus. The proximal tibia cutting guide from  the ex medullary system was applied and the proximal tibia cut carried out.  The distal femoral cut was carried out in a similar fashion using the intramedullary guide resecting additional 2 mm secondary to severe flexion contraction.  Sizing guide was applied and the femur sized to a size 5.    The 5 femoral cutting guide applied with anterior posterior and chamfer cuts made.  The posterior horns of the menisci were removed at this point.   Injection of the above medication was carried out after the femoral and tibial cuts were carried out.  The 5  left baseplate trial was placed pinned into position and proximal tibial preparation carried out with drilling hand reaming and the keel punch followed by placement of the 5 left femur and sizing the tibial insert size 14 millimeter gave the best fit with stability and full extension.  The distal femoral drill holes were made in the notch cut for the trochlear groove was then carried out with trials were then removed the patella was cut using the patellar cutting guide and it sized to a size 2 after drill holes have been made  The knee was irrigated with pulsatile lavage and the bony surfaces dried the tibial component was cemented into place first.  Excess cement was removed and the polyethylene insert placed with a torque screw placed with a torque screwdriver tightened.  The distal femoral component was placed and the knee was held in extension as the patellar button was clamped into place.  After the cement was set, excess cement was removed and the knee was again irrigated thoroughly thoroughly irrigated.  The tourniquet was let down and hemostasis checked with electrocautery. The arthrotomy was repaired with a heavy Quill suture,  followed by 3-0 V lock subcuticular closure, skin staples followed by incisional wound VAC and Polar Care.Marland Kitchen   PLAN OF CARE: Admit for overnight observation   PATIENT DISPOSITION:  PACU - hemodynamically stable.

## 2022-07-04 NOTE — Evaluation (Signed)
Physical Therapy Evaluation Patient Details Name: Dana Bean MRN: [id number] DOB: [birth year] Today's Date: 07/04/2022  History of Present Illness  admitted for acute hospitalization s/p L TKA (07/04/22), WBAT.  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  Rates L knee pain 6/10; meds received prior to session.  Demonstrates very guarded strength (3-/5) and ROM (L knee approx 18-55 degrees) to L LE; generally  limited by pain.  Limited active quad control, requiring physical assist from therapist for movement of extremities all joints, all planes.  Currently requiring min/mod assist for bed mobility; mod indep for unsupported sitting balance; mod assist +2 for sit/stand and standing balance with RW.   Requires assist for LE positioning, lift off and standing balance.  Very heavy use of bilat UEs to initiate and complete task.  Able to clear buttocks from edge of bed, but maintains very flexed posture.  Unsafe/unable to attempt gait at this time; will continue to assess/progress in subsequent sessions as appropriate. Would benefit from skilled PT to address above deficits and promote optimal return to PLOF.; recommend transition to STR upon discharge from acute hospitalization.      Recommendations for follow up therapy are one component of a multi-disciplinary discharge planning process, led by the attending physician.  Recommendations may be updated based on patient status, additional functional criteria and insurance authorization.  Follow Up Recommendations Skilled nursing-short term rehab (<3 hours/day)      Assistance Recommended at Discharge Frequent or constant Supervision/Assistance  Patient can return home with the following  Two people to help with walking and/or  transfers;Two people to help with bathing/dressing/bathroom;Assist for transportation;Help with stairs or ramp for entrance;Assistance with cooking/housework    Equipment Recommendations Rolling walker (2 wheels);BSC/3in1  Recommendations for Other Services       Functional Status Assessment Patient has had a recent decline in their functional status and demonstrates the ability to make significant improvements in function in a reasonable and predictable amount of time.     Precautions / Restrictions Precautions Precautions: Fall Restrictions Weight Bearing Restrictions: Yes LLE Weight Bearing: Weight bearing as tolerated      Mobility  Bed Mobility Overal bed mobility: Needs Assistance Bed Mobility: Supine to Sit, Sit to Supine     Supine to sit: Min assist Sit to supine: Mod assist   General bed mobility comments: assist for LE management in/out of bed    Transfers Overall transfer level: Needs assistance Equipment used: Rolling walker (2 wheels) Transfers: Sit to/from Stand Sit to Stand: Mod assist, +2 physical assistance           General transfer comment: assist for LE positioning, lift off and standing balance.  Very heavy use of bilat UEs to initiate and complete task.  Able to clear buttocks from edge of bed, but maintains very flexed posture    Ambulation/Gait               General Gait Details: unsafe/unable  Stairs            Wheelchair Mobility    Modified Rankin (Stroke Patients Only)       Balance Overall balance assessment: Needs assistance Sitting-balance support: No upper extremity supported, Feet supported Sitting balance-Leahy Scale: Good     Standing balance support: Bilateral upper extremity supported Standing  balance-Leahy Scale: Poor                               Pertinent Vitals/Pain Pain Assessment Pain Assessment: 0-10 Pain Score: 6  Pain Location: L knee Pain Descriptors / Indicators: Aching,  Grimacing, Guarding Pain Intervention(s): Limited activity within patient's tolerance, Monitored during session, Premedicated before session, Repositioned    Home Living Family/patient expects to be discharged to:: Private residence Living Arrangements: Spouse/significant other Available Help at Discharge: Family;Friend(s);Available 24 hours/day Type of Home: House Home Access: Stairs to enter Entrance Stairs-Rails: Right;Left;Can reach both Entrance Stairs-Number of Steps: 5   Home Layout: One level Home Equipment: Cane - single point;Standard Walker;Rollator (4 wheels) Additional Comments: per the patient she has a RW and 3-in-1 bedside commode coming to her home.    Prior Function Prior Level of Function : Needs assist             Mobility Comments: Since recent R LE surgical repair, patient ambulating household distances with 4WRW; using manual WC as needed       Hand Dominance        Extremity/Trunk Assessment   Upper Extremity Assessment Upper Extremity Assessment: Overall WFL for tasks assessed    Lower Extremity Assessment Lower Extremity Assessment:  (R LE grossly 3-/5 throughout; L LE grossly 3-/5 throughout (limited by pain))       Communication   Communication: No difficulties  Cognition Arousal/Alertness: Awake/alert Behavior During Therapy: WFL for tasks assessed/performed Overall Cognitive Status: Within Functional Limits for tasks assessed                                          General Comments      Exercises Total Joint Exercises Goniometric ROM: L knee: approx 18-55 degrees, limited by pain Other Exercises Other Exercises: Supine L LE therex, 1x10, active ROM: ankle pumps, quad sets, SAQs, heel slides, hip abduct/adduct.  Guarded isolated muscle activation, requiring physical assist from therapist for movement throughout all planes.   Assessment/Plan    PT Assessment Patient needs continued PT services  PT Problem List  Decreased strength;Decreased range of motion;Decreased activity tolerance;Decreased balance;Decreased mobility;Decreased knowledge of use of DME;Decreased safety awareness;Decreased knowledge of precautions;Pain;Cardiopulmonary status limiting activity       PT Treatment Interventions DME instruction;Gait training;Stair training;Functional mobility training;Therapeutic activities;Therapeutic exercise;Balance training;Patient/family education    PT Goals (Current goals can be found in the Care Plan section)  Acute Rehab PT Goals Patient Stated Goal: to go home PT Goal Formulation: With patient Time For Goal Achievement: 07/18/22 Potential to Achieve Goals: Good    Frequency BID     Co-evaluation               AM-PAC PT "6 Clicks" Mobility  Outcome Measure Help needed turning from your back to your side while in a flat bed without using bedrails?: A Little Help needed moving from lying on your back to sitting on the side of a flat bed without using bedrails?: A Lot Help needed moving to and from a bed to a chair (including a wheelchair)?: A Lot Help needed standing up from a chair using your arms (e.g., wheelchair or bedside chair)?: A Lot Help needed to walk in hospital room?: Total Help needed climbing 3-5 steps with a railing? : Total 6 Click Score: 11  End of Session Equipment Utilized During Treatment: Gait belt Activity Tolerance: Patient limited by pain Patient left: in bed;with call bell/phone within reach;with bed alarm set Nurse Communication: Mobility status PT Visit Diagnosis: Other abnormalities of gait and mobility (R26.89);Pain;Muscle weakness (generalized) (M62.81) Pain - Right/Left: Left Pain - part of body: Knee    Time: 3845-3646 PT Time Calculation (min) (ACUTE ONLY): 39 min   Charges:   PT Evaluation $PT Eval Moderate Complexity: 1 Mod PT Treatments $Therapeutic Exercise: 8-22 mins $Therapeutic Activity: 8-22 mins       Shaima Sardinas H. Owens Shark,  PT, DPT, NCS 07/04/22, 4:46 PM [phone]

## 2022-07-04 NOTE — Transfer of Care (Signed)
Immediate Anesthesia Transfer of Care Note  Patient: Dana Bean  Procedure(s) Performed: TOTAL KNEE ARTHROPLASTY (Left: Knee)  Patient Location: PACU  Anesthesia Type:General  Level of Consciousness: awake, alert  and oriented  Airway & Oxygen Therapy: Patient Spontanous Breathing and Patient connected to face mask oxygen  Post-op Assessment: Report given to RN  Post vital signs: Reviewed and stable  Last Vitals:  Vitals Value Taken Time  BP 128/72 07/04/22 0959  Temp    Pulse 87 07/04/22 1002  Resp 22 07/04/22 1002  SpO2 100 % 07/04/22 1002  Vitals shown include unvalidated device data.  Last Pain:  Vitals:   07/04/22 0615  TempSrc: Oral  PainSc: 0-No pain         Complications: No notable events documented.

## 2022-07-05 DIAGNOSIS — Z6841 Body Mass Index (BMI) 40.0 and over, adult: Secondary | ICD-10-CM | POA: Diagnosis not present

## 2022-07-05 DIAGNOSIS — S82221K Displaced transverse fracture of shaft of right tibia, subsequent encounter for closed fracture with nonunion: Secondary | ICD-10-CM | POA: Diagnosis not present

## 2022-07-05 DIAGNOSIS — G629 Polyneuropathy, unspecified: Secondary | ICD-10-CM | POA: Diagnosis present

## 2022-07-05 DIAGNOSIS — K219 Gastro-esophageal reflux disease without esophagitis: Secondary | ICD-10-CM | POA: Diagnosis present

## 2022-07-05 DIAGNOSIS — M170 Bilateral primary osteoarthritis of knee: Secondary | ICD-10-CM | POA: Diagnosis present

## 2022-07-05 DIAGNOSIS — I10 Essential (primary) hypertension: Secondary | ICD-10-CM | POA: Diagnosis present

## 2022-07-05 DIAGNOSIS — Z90710 Acquired absence of both cervix and uterus: Secondary | ICD-10-CM | POA: Diagnosis not present

## 2022-07-05 DIAGNOSIS — G2581 Restless legs syndrome: Secondary | ICD-10-CM | POA: Diagnosis present

## 2022-07-05 DIAGNOSIS — X58XXXD Exposure to other specified factors, subsequent encounter: Secondary | ICD-10-CM | POA: Diagnosis present

## 2022-07-05 DIAGNOSIS — Z79899 Other long term (current) drug therapy: Secondary | ICD-10-CM | POA: Diagnosis not present

## 2022-07-05 DIAGNOSIS — Z85830 Personal history of malignant neoplasm of bone: Secondary | ICD-10-CM | POA: Diagnosis not present

## 2022-07-05 DIAGNOSIS — Z96652 Presence of left artificial knee joint: Secondary | ICD-10-CM

## 2022-07-05 DIAGNOSIS — Z8261 Family history of arthritis: Secondary | ICD-10-CM | POA: Diagnosis not present

## 2022-07-05 DIAGNOSIS — E785 Hyperlipidemia, unspecified: Secondary | ICD-10-CM | POA: Diagnosis present

## 2022-07-05 DIAGNOSIS — D62 Acute posthemorrhagic anemia: Secondary | ICD-10-CM | POA: Diagnosis not present

## 2022-07-05 DIAGNOSIS — Z880 Allergy status to penicillin: Secondary | ICD-10-CM | POA: Diagnosis not present

## 2022-07-05 DIAGNOSIS — F32A Depression, unspecified: Secondary | ICD-10-CM | POA: Diagnosis present

## 2022-07-05 LAB — BASIC METABOLIC PANEL
Anion gap: 6 (ref 5–15)
BUN: 17 mg/dL (ref 8–23)
CO2: 26 mmol/L (ref 22–32)
Calcium: 8.5 mg/dL — ABNORMAL LOW (ref 8.9–10.3)
Chloride: 106 mmol/L (ref 98–111)
Creatinine, Ser: 0.87 mg/dL (ref 0.44–1.00)
GFR, Estimated: >60 mL/min (ref >60)
Glucose, Bld: 119 mg/dL — ABNORMAL HIGH (ref 70–99)
Potassium: 3.8 mmol/L (ref 3.5–5.1)
Sodium: 138 mmol/L (ref 135–145)

## 2022-07-05 LAB — CBC
HCT: 29.9 % — ABNORMAL LOW (ref 36.0–46.0)
Hemoglobin: 9.8 g/dL — ABNORMAL LOW (ref 12.0–15.0)
MCH: 30.9 pg (ref 26.0–34.0)
MCHC: 32.8 g/dL (ref 30.0–36.0)
MCV: 94.3 fL (ref 80.0–100.0)
Platelets: 267 10*3/uL (ref 150–400)
RBC: 3.17 MIL/uL — ABNORMAL LOW (ref 3.87–5.11)
RDW: 13.5 % (ref 11.5–15.5)
WBC: 9.8 10*3/uL (ref 4.0–10.5)
nRBC: 0 % (ref 0.0–0.2)

## 2022-07-05 MED ORDER — OXYCODONE HCL 10 MG PO TABS: 10.0000 mg | ORAL_TABLET | ORAL | 0 refills | Status: AC | PRN

## 2022-07-05 MED ORDER — SODIUM CHLORIDE 0.9 % IV BOLUS
1000.0000 mL | Freq: Once | INTRAVENOUS | Status: AC
  Administered 2022-07-05: 1000 mL via INTRAVENOUS

## 2022-07-05 MED ORDER — TRAMADOL HCL 50 MG PO TABS: 50.0000 mg | ORAL_TABLET | Freq: Four times a day (QID) | ORAL | 0 refills | Status: AC | PRN

## 2022-07-05 MED ORDER — ENOXAPARIN SODIUM 40 MG/0.4ML IJ SOSY: 40.0000 mg | PREFILLED_SYRINGE | INTRAMUSCULAR | 0 refills | Status: AC

## 2022-07-05 MED ORDER — DOCUSATE SODIUM 100 MG PO CAPS: 100.0000 mg | ORAL_CAPSULE | Freq: Two times a day (BID) | ORAL | 0 refills | Status: AC

## 2022-07-05 MED ORDER — FE FUMARATE-B12-VIT C-FA-IFC PO CAPS
1.0000 | ORAL_CAPSULE | Freq: Two times a day (BID) | ORAL | Status: DC
  Administered 2022-07-05 – 2022-07-06 (×3): 1 via ORAL
  Filled 2022-07-05 (×4): qty 1

## 2022-07-05 MED ORDER — FE FUMARATE-B12-VIT C-FA-IFC PO CAPS: 1.0000 | ORAL_CAPSULE | Freq: Two times a day (BID) | ORAL | 0 refills | Status: AC

## 2022-07-05 NOTE — Progress Notes (Signed)
Physical Therapy Treatment Patient Details Name: Dana Bean MRN: [id number] DOB: 1956-04-19 Today's Date: 07/05/2022   History of Present Illness admitted for acute hospitalization s/p L TKA (07/04/22), WBAT.  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 Due to pt's low BP, deferred stand pivot for lateral scoot from drop arm recliner>bed. PT provides cuing for head/hips relationship, assistance with scooting, & pt requiring extra time to complete movement. Pt is then able to complete sit>supine & adjusting in bed with supervision with reliance on bed rails & significantly extra time but pt able to use RLE to support LLE during movement. Pt still remains eager to go home upon d/c despite PT recommendation continuing to be STR. When PT discussed stair negotiation to enter home, pt reports she has car ramps that her husband pushes her up in w/c to bypass the steps.    Recommendations for follow up therapy are one component of a multi-disciplinary discharge planning process, led by the attending physician.  Recommendations may be updated based on patient status, additional functional criteria and insurance authorization.  Follow Up Recommendations  Skilled nursing-short term rehab (<3 hours/day) Can patient physically be transported by private vehicle: No   Assistance Recommended at Discharge Frequent or constant Supervision/Assistance  Patient can return home with the following Two people to help with walking and/or transfers;Two people to help with bathing/dressing/bathroom;Assist for transportation;Help with stairs or ramp for entrance;Assistance with cooking/housework   Equipment Recommendations  Rolling walker (2 wheels);BSC/3in1    Recommendations for Other Services       Precautions / Restrictions Precautions Precautions:  Fall Restrictions Weight Bearing Restrictions: Yes LLE Weight Bearing: Weight bearing as tolerated     Mobility  Bed Mobility Overal bed mobility: Needs Assistance Bed Mobility: Supine to Sit, Sit to Supine     Supine to sit: Supervision, HOB elevated Sit to supine: Supervision, HOB elevated (heavy reliance on hospital bed features, extra time)   General bed mobility comments: HOB significantly elevated, uses BUE to move LLE to EOB    Transfers Overall transfer level: Needs assistance Equipment used: Rolling walker (2 wheels) Transfers: Bed to chair/wheelchair/BSC Sit to Stand: Min guard (elevated EOB) Stand pivot transfers: Min assist (assistance to safely sit in recliner)        Lateral/Scoot Transfers: Min assist General transfer comment: Pt completes lateral scoot drop arm recliner>bed with min assist with cuing for head/hips relationship & assistance with scooting hips. Pt requires extra time to complete entire transfer.    Ambulation/Gait                   Stairs             Wheelchair Mobility    Modified Rankin (Stroke Patients Only)       Balance Overall balance assessment: Needs assistance Sitting-balance support: Bilateral upper extremity supported, Feet supported Sitting balance-Leahy Scale: Good     Standing balance support: Bilateral upper extremity supported, During functional activity, Reliant on assistive device for balance Standing balance-Leahy Scale: Poor                              Cognition Arousal/Alertness: Awake/alert Behavior During Therapy: WFL for tasks assessed/performed Overall Cognitive Status: Within Functional Limits for tasks assessed  Exercises Total Joint Exercises Ankle Circles/Pumps: AROM, Left, Supine, 10 reps Quad Sets: AROM, Strengthening, Left, 10 reps, Supine Heel Slides: AAROM, Strengthening, Left, 10 reps, Supine Goniometric  ROM: L knee: 20 degrees from full extension, 70 degrees flexion, limited by pain    General Comments        Pertinent Vitals/Pain Pain Assessment Pain Assessment: Faces Pain Score: 10-Worst pain ever Faces Pain Scale: Hurts whole lot Pain Location: L knee Pain Descriptors / Indicators: Aching, Grimacing, Guarding Pain Intervention(s): Monitored during session (pt not crying during this session as compared to last)    Home Living Family/patient expects to be discharged to:: Private residence Living Arrangements: Spouse/significant other Available Help at Discharge: Family;Friend(s);Available 24 hours/day Type of Home: House Home Access: Stairs to enter Entrance Stairs-Rails: Right;Left;Can reach both Entrance Stairs-Number of Steps: 5   Home Layout: One level Home Equipment: Cane - single Architectural technologist (4 wheels);BSC/3in1      Prior Function            PT Goals (current goals can now be found in the care plan section) Acute Rehab PT Goals Patient Stated Goal: to go home, decreased pain PT Goal Formulation: With patient Time For Goal Achievement: 07/18/22 Potential to Achieve Goals: Good Progress towards PT goals: Progressing toward goals    Frequency    BID      PT Plan Current plan remains appropriate    Co-evaluation              AM-PAC PT "6 Clicks" Mobility   Outcome Measure  Help needed turning from your back to your side while in a flat bed without using bedrails?: A Little Help needed moving from lying on your back to sitting on the side of a flat bed without using bedrails?: A Lot Help needed moving to and from a bed to a chair (including a wheelchair)?: A Little Help needed standing up from a chair using your arms (e.g., wheelchair or bedside chair)?: A Lot Help needed to walk in hospital room?: Total Help needed climbing 3-5 steps with a railing? : Total 6 Click Score: 12    End of Session Equipment Utilized During  Treatment: Gait belt Activity Tolerance: Patient limited by pain;Patient tolerated treatment well Patient left: in bed;with nursing/sitter in room (SCD on RLE, polar care on LLE, wound vac intact L knee) Nurse Communication: Mobility status PT Visit Diagnosis: Other abnormalities of gait and mobility (R26.89);Pain;Muscle weakness (generalized) (M62.81) Pain - Right/Left: Left Pain - part of body: Knee     Time: 8182-9937 PT Time Calculation (min) (ACUTE ONLY): 19 min  Charges:  $Therapeutic Activity: 8-22 mins                     Lavone Nian, PT, DPT 07/05/22, 1:42 PM  Waunita Schooner 07/05/2022, 1:40 PM

## 2022-07-05 NOTE — Discharge Summary (Signed)
Physician Discharge Summary  Patient ID: Dana Bean MRN: [id number] DOB/AGE: 08/02/1956 66 y.o.  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 female who was admitted 07/04/2022 with a diagnosis of Status post revision of total knee replacement, left and went to the operating room on 07/04/2022 and underwent the above named procedures.    Surgeries: Procedure(s): TOTAL KNEE ARTHROPLASTY on 07/04/2022 Patient tolerated the surgery well. Taken to PACU where she was stabilized and then transferred to the orthopedic floor.  Started on Lovenox 30 mg q 12 hrs. Foot pumps applied bilaterally at 80 mm. Heels elevated on bed with rolled towels. No evidence of DVT. Negative Homan. Physical therapy started on day #1 for gait training and transfer. OT started day #1 for ADL and assisted devices.  Patient's foley was d/c on day #1. Patient's IV  was d/c on day #2.  On post op day #2 patient was stable and ready for discharge to home with HHPT.    She was given perioperative  antibiotics:  Anti-infectives (From admission, onward)    Start     Dose/Rate Route Frequency Ordered Stop   07/04/22 1230  ceFAZolin (ANCEF) IVPB 3g/100 mL premix        3 g 200 mL/hr over 30 Minutes Intravenous Every 6 hours 07/04/22 1142 07/05/22 0619   07/04/22 0600  ceFAZolin (ANCEF) IVPB 3g/100 mL premix        3 g 200 mL/hr over 30 Minutes Intravenous On call to O.R. 07/03/22 2318 07/04/22 0740     .  She was given sequential compression devices, early ambulation, and Lovenox TEDs for DVT prophylaxis.  She benefited maximally from the hospital stay and there were no complications.    Recent vital signs:  Vitals:   07/05/22 2311 07/06/22 0518  BP: (!) 124/53 (!) 122/57  Pulse: 90 87  Resp: 18 17  Temp:  98.6 F (37 C)  SpO2:  96%    Recent laboratory studies:  Lab Results  Component Value Date   HGB 9.8 (L) 07/05/2022   HGB 11.3 (L) 07/04/2022   HGB 11.2 (L) 06/21/2022   Lab Results  Component Value Date   WBC 9.8 07/05/2022   PLT 267 07/05/2022   No results found for: "INR" Lab Results  Component Value Date   NA 138 07/05/2022   K 3.8 07/05/2022   CL 106 07/05/2022   CO2 26 07/05/2022   BUN 17 07/05/2022   CREATININE 0.87 07/05/2022   GLUCOSE 119 (H) 07/05/2022  Discharge Medications:   Allergies as of 07/06/2022       Reactions   Augmentin [amoxicillin-pot Clavulanate] Nausea And Vomiting        Medication List     STOP taking these medications    meloxicam 15 MG tablet Commonly known as: MOBIC       TAKE these medications    b complex vitamins capsule Take 1 capsule by mouth daily.   budesonide-formoterol 80-4.5 MCG/ACT inhaler Commonly known as: SYMBICORT Inhale 2 puffs into the lungs 2 (two) times daily as needed.   Cholecalciferol 25 MCG (1000 UT) capsule Take 1,000 Units by mouth daily.   clotrimazole-betamethasone cream Commonly known as: LOTRISONE Apply 1 Application topically 2 (two) times daily as needed. Under  breasts   docusate sodium 100 MG capsule Commonly known as: COLACE Take 1 capsule (100 mg total) by mouth 2 (two) times daily.   enoxaparin 40 MG/0.4ML injection Commonly known as: LOVENOX Inject 0.4 mLs (40 mg total) into the skin daily for 14 days.   erythromycin ophthalmic ointment Place 1 Application into both eyes at bedtime.   ferrous [id number]-[id number] C-folic acid capsule Commonly known as: TRINSICON / FOLTRIN Take 1 capsule by mouth 2 (two) times daily after a meal.   Fish Oil 1000 MG Caps Take 1 capsule by mouth daily.   gabapentin 300 MG capsule Commonly known as: NEURONTIN Take 300 mg by mouth 3 (three) times daily.   lisinopril 10 MG tablet Commonly known as: ZESTRIL Take 10 mg by mouth daily.   Magnesium 400 MG Tabs Take 1 tablet by mouth daily.   MULTIVITAMIN ADULT PO Take 1 tablet by mouth daily.   oxyCODONE 10 mg 12 hr tablet Commonly known as: OXYCONTIN Take 10 mg by mouth 4 (four) times daily. What changed: Another medication with the same name was added. Make sure you understand how and when to take each.   Oxycodone HCl 10 MG Tabs Take 1-1.5 tablets (10-15 mg total) by mouth every 4 (four) hours as needed for severe pain (pain score 7-10). What changed: You were already taking a medication with the same name, and this prescription was added. Make sure you understand how and when to take each.   pantoprazole 40 MG tablet Commonly known as: PROTONIX Take 40 mg by mouth daily.   pravastatin 20 MG tablet Commonly known as: PRAVACHOL Take 20 mg by mouth daily with supper.   rOPINIRole 1 MG tablet Commonly known as: REQUIP Take 1 mg by mouth in the morning and at bedtime.   sertraline 50 MG tablet Commonly known as: ZOLOFT Take 150 mg by mouth daily.   tiZANidine 4 MG capsule Commonly known as: ZANAFLEX Take 4 mg by mouth 3 (three) times daily as needed for muscle spasms.   traMADol 50 MG tablet Commonly known as: ULTRAM Take 1 tablet  (50 mg total) by mouth every 6 (six) hours as needed.   triamterene-hydrochlorothiazide 37.5-25 MG tablet Commonly known as: MAXZIDE-25 Take 1 tablet by mouth daily.               Durable Medical Equipment  (From admission, onward)           Start     Ordered   07/04/22 1143  DME Walker rolling  Once       Question Answer Comment  Walker: With 5 Inch Wheels   Patient needs a walker to treat with the following condition Status post revision of total knee replacement, left  07/04/22 1142   07/04/22 1143  DME 3 n 1  Once        07/04/22 1142   07/04/22 1143  DME Bedside commode  Once       Question:  Patient needs a bedside commode to treat with the following condition  Answer:  Status post revision of total knee replacement, left   07/04/22 1142            Diagnostic Studies: DG Knee 1-2 Views Left  Result Date: 07/04/2022 CLINICAL DATA:  Status post revision left total knee replacement. EXAM: LEFT KNEE - 1-2 VIEW COMPARISON:  October 15, 2021. FINDINGS: The left femoral and tibial components are well situated. Expected postoperative changes are noted in the soft tissues anteriorly. IMPRESSION: Status post left total knee arthroplasty. Electronically Signed   By: Marijo Conception M.D.   On: 07/04/2022 10:32    Disposition: Discharge disposition: 01-Home or Self Care          Follow-up Information     Duanne Guess, PA-C Follow up in 2 week(s).   Specialties: Orthopedic Surgery, Emergency Medicine Contact information: [place] [address] [phone]                  Signed: Reche Dixon 07/06/2022, 6:39 AM

## 2022-07-05 NOTE — Progress Notes (Signed)
   Subjective: 1 Day Post-Op Procedure(s) (LRB): TOTAL KNEE ARTHROPLASTY (Left) Patient reports pain as moderate and severe.   Patient is well, and has had no acute complaints or problems Denies any CP, SOB, ABD pain. We will continue therapy today.  Plan is to go Home after hospital stay.  Objective: Vital signs in last 24 hours: Temp:  [97.4 F (36.3 C)-98.3 F (36.8 C)] 98.1 F (36.7 C) (08/18 0741) Pulse Rate:  [66-94] 78 (08/18 0741) Resp:  [11-20] 16 (08/18 0741) BP: (95-136)/(45-72) 120/59 (08/18 0741) SpO2:  [82 %-99 %] 95 % (08/18 0741)  Intake/Output from previous day: 08/17 0701 - 08/18 0700 In: 1810 [P.O.:160; I.V.:1550; IV Piggyback:100] Out: 1800 [Urine:1700; Blood:100] Intake/Output this shift: No intake/output data recorded.  Recent Labs    07/04/22 1251 07/05/22 0432  HGB 11.3* 9.8*   Recent Labs    07/04/22 1251 07/05/22 0432  WBC 13.7* 9.8  RBC 3.68* 3.17*  HCT 35.0* 29.9*  PLT 269 267   Recent Labs    07/04/22 1251 07/05/22 0432  NA  --  138  K  --  3.8  CL  --  106  CO2  --  26  BUN  --  17  CREATININE 0.87 0.87  GLUCOSE  --  119*  CALCIUM  --  8.5*   No results for input(s): "LABPT", "INR" in the last 72 hours.  EXAM General - Patient is Alert, Appropriate, and Oriented Extremity - Sensation intact distally Intact pulses distally Dorsiflexion/Plantar flexion intact No cellulitis present Compartment soft Dressing - dressing C/D/I and no drainage, provena intact with out drainage Motor Function - intact, moving foot and toes well on exam.   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   Weight as of this encounter: 127.5 kg. Advance diet Up with therapy Acute postop blood loss anemia-hemoglobin 9.8.  Start iron supplement Vital signs are stable Recheck labs in the morning Care management to assist with discharge to home with home health PT tomorrow 07/06/2022 pending completion of PT goals   DVT Prophylaxis - Lovenox, TED hose, and SCDs Weight-Bearing as tolerated to left leg   T. Rachelle Hora, PA-C [place] 07/05/2022, 8:14 AM

## 2022-07-05 NOTE — Evaluation (Signed)
Occupational Therapy Evaluation Patient Details Name: Dana Bean MRN: [id number] DOB: 1956/08/05 Today's Date: 07/05/2022   History of Present Illness admitted for acute hospitalization s/p L TKA (07/04/22), WBAT.  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 PTA pt reports assist required following RLE surgery for ADL/IADL , amb with rollator household distances. Pt is limited by pain on this date, with RN reporting pt recently provided pain medication. Pt presents with deficits in strength, endurance, activity tolerance all affecting safe and optimal ADL completion. Pt reports she will be returning home with home therapy and states her husband will assist for any/all physical needs. Pt requires MIN A for STS with significantly flexed posture, unable to come into full upright standing and unable to take steps. Education provided re: discharge recommendations, home safety, DME use, falls prevention, importance of OOB mobility. Pt is left as received, NAD, all needs met. Rn aware of pt pain status. Recommend STR at this time to address functional deficits. OT will continue to follow acutely.       Recommendations for follow up therapy are one component of a multi-disciplinary discharge planning process, led by the attending physician.  Recommendations may be updated based on patient status, additional functional criteria and insurance authorization.   Follow Up Recommendations  Skilled nursing-short term rehab (<3 hours/day)    Assistance Recommended at Discharge Frequent or constant Supervision/Assistance  Patient can return home with the following A lot of help with bathing/dressing/bathroom;A lot of help with walking and/or transfers    Functional Status Assessment  Patient has had a recent decline in their functional status and demonstrates the ability to make significant  improvements in function in a reasonable and predictable amount of time.  Equipment Recommendations  BSC/3in1;Tub/shower seat    Recommendations for Other Services       Precautions / Restrictions Precautions Precautions: Fall Restrictions Weight Bearing Restrictions: Yes LLE Weight Bearing: Weight bearing as tolerated      Mobility Bed Mobility Overal bed mobility: Needs Assistance Bed Mobility: Supine to Sit, Sit to Supine     Supine to sit: Min assist, HOB elevated Sit to supine: Min guard, HOB elevated   General bed mobility comments: significantly increased time and effort throughout    Transfers Overall transfer level: Needs assistance Equipment used: Rolling walker (2 wheels) Transfers: Sit to/from Stand Sit to Stand: Min guard, From elevated surface           General transfer comment: pt unable to maintain upright standing for peri care, significantly flexed posture      Balance Overall balance assessment: Needs assistance Sitting-balance support: Bilateral upper extremity supported, Feet supported Sitting balance-Leahy Scale: Good     Standing balance support: Bilateral upper extremity supported, During functional activity, Reliant on assistive device for balance Standing balance-Leahy Scale: Poor                             ADL either performed or assessed with clinical judgement   ADL Overall ADL's : Needs assistance/impaired                                       General ADL Comments: MOD-MAX A for UB bathing, MAX A for LB bathing, MAX A required  for peri care, MIN A required for UB dressing     Vision Patient Visual Report: No change from baseline       Perception     Praxis      Pertinent Vitals/Pain Pain Assessment Pain Assessment: 0-10 Pain Score: 10-Worst pain ever Pain Location: L nee Pain Descriptors / Indicators: Aching, Grimacing, Guarding Pain Intervention(s): Monitored during session,  Repositioned, Patient requesting pain meds-RN notified, Limited activity within patient's tolerance, Premedicated before session, Ice applied     Hand Dominance     Extremity/Trunk Assessment Upper Extremity Assessment Upper Extremity Assessment: Overall WFL for tasks assessed   Lower Extremity Assessment Lower Extremity Assessment: LLE deficits/detail LLE Deficits / Details: L knee unable to tolerate full extension in bone foam       Communication Communication Communication: No difficulties   Cognition Arousal/Alertness: Awake/alert Behavior During Therapy: WFL for tasks assessed/performed Overall Cognitive Status: Within Functional Limits for tasks assessed                                       General Comments       Exercises     Shoulder Instructions      Home Living Family/patient expects to be discharged to:: Private residence Living Arrangements: Spouse/significant other Available Help at Discharge: Family;Friend(s);Available 24 hours/day Type of Home: House Home Access: Stairs to enter [employer] of Steps: 5 Entrance Stairs-Rails: Right;Left;Can reach both Home Layout: One level     Bathroom Shower/Tub: Walk-in shower         Home Equipment: Cane - single Architectural technologist (4 wheels);BSC/3in1          Prior Functioning/Environment Prior Level of Function : Needs assist             Mobility Comments: amb household distances with rollator, mwc at needed ADLs Comments: since recent RLE surgery, pt reports asisst required for ADL/IADL        OT Problem List: Decreased strength;Decreased activity tolerance;Impaired balance (sitting and/or standing);Decreased knowledge of use of DME or AE      OT Treatment/Interventions: Self-care/ADL training;Patient/family education;Therapeutic exercise;Energy conservation;Therapeutic activities;DME and/or AE instruction;Balance training    OT Goals(Current goals can  be found in the care plan section) Acute Rehab OT Goals Patient Stated Goal: go home OT Goal Formulation: With patient Time For Goal Achievement: 07/19/22 Potential to Achieve Goals: Fair ADL Goals Pt Will Perform Grooming: standing;sitting Pt Will Perform Lower Body Dressing: with modified independence;sit to/from stand Pt Will Transfer to Toilet: with supervision Pt Will Perform Toileting - Clothing Manipulation and hygiene: with supervision;sit to/from stand  OT Frequency: Min 2X/week    Co-evaluation              AM-PAC OT "6 Clicks" Daily Activity     Outcome Measure Help from another person eating meals?: None Help from another person taking care of personal grooming?: None Help from another person toileting, which includes using toliet, bedpan, or urinal?: A Lot Help from another person bathing (including washing, rinsing, drying)?: A Lot Help from another person to put on and taking off regular upper body clothing?: A Little Help from another person to put on and taking off regular lower body clothing?: A Lot 6 Click Score: 17   End of Session Equipment Utilized During Treatment: Rolling walker (2 wheels) Nurse Communication: Mobility status;Patient requests pain meds  Activity Tolerance: Patient limited by pain Patient  left: in bed;with call bell/phone within reach;with bed alarm set  OT Visit Diagnosis: Unsteadiness on feet (R26.81);History of falling (Z91.81);Other abnormalities of gait and mobility (R26.89)                Time: 7867-6720 OT Time Calculation (min): 20 min Charges:  OT General Charges $OT Visit: 1 Visit OT Evaluation $OT Eval Low Complexity: 1 Low  Shanon Payor, OTD OTR/L  07/05/22, 1:10 PM

## 2022-07-05 NOTE — Discharge Instructions (Signed)

## 2022-07-05 NOTE — Progress Notes (Signed)
Physical Therapy Treatment Patient Details Name: Dana Bean MRN: [id number] DOB: 10/05/1956 Today's Date: 07/05/2022   History of Present Illness admitted for acute hospitalization s/p L TKA (07/04/22), WBAT.  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 Pt engages in LLE strengthening exercises with AAROM PRN & pt limited by pain. Pt is eager to perform mobility tasks herself & is able to complete supine>sit with Dana Bean almost fully elevated with supervision & extra time.  Pt completes STS from EOB with min assist & requires up to mod assist for stand pivot to recliner with pt able to weight shift L<>R & step with LLE but is crying throughout 2/2 L knee pain. During session pt states "I'm busting out of here tomorrow. I'm going home." & reports her husband can assist her in the car & she can use a bed pan, as she's done it before. Current PT recommends are STR & remain appropriate at this time.    Recommendations for follow up therapy are one component of a multi-disciplinary discharge planning process, led by the attending physician.  Recommendations may be updated based on patient status, additional functional criteria and insurance authorization.  Follow Up Recommendations  Skilled nursing-short term rehab (<3 hours/day) Can patient physically be transported by private vehicle: No   Assistance Recommended at Discharge Frequent or constant Supervision/Assistance  Patient can return home with the following Two people to help with walking and/or transfers;Two people to help with bathing/dressing/bathroom;Assist for transportation;Help with stairs or ramp for entrance;Assistance with cooking/housework   Equipment Recommendations  Rolling walker (2 wheels);BSC/3in1    Recommendations for Other Services       Precautions /  Restrictions Precautions Precautions: Fall Restrictions Weight Bearing Restrictions: Yes LLE Weight Bearing: Weight bearing as tolerated     Mobility  Bed Mobility Overal bed mobility: Needs Assistance Bed Mobility: Supine to Sit, Sit to Supine     Supine to sit: Supervision, HOB elevated     General bed mobility comments: HOB significantly elevated, uses BUE to move LLE to EOB    Transfers Overall transfer level: Needs assistance Equipment used: Rolling walker (2 wheels) Transfers: Sit to/from Stand, Bed to chair/wheelchair/BSC Sit to Stand: Min guard (elevated EOB) Stand pivot transfers: Min assist (assistance to safely sit in recliner)              Ambulation/Gait                   Stairs             Wheelchair Mobility    Modified Rankin (Stroke Patients Only)       Balance Overall balance assessment: Needs assistance Sitting-balance support: Bilateral upper extremity supported, Feet supported Sitting balance-Leahy Scale: Good     Standing balance support: Bilateral upper extremity supported, During functional activity, Reliant on assistive device for balance Standing balance-Leahy Scale: Poor                              Cognition Arousal/Alertness: Awake/alert Behavior During Therapy: WFL for tasks assessed/performed Overall Cognitive Status: Within Functional Limits for tasks assessed  Exercises Total Joint Exercises Ankle Circles/Pumps: AROM, Left, Supine, 10 reps Quad Sets: AROM, Strengthening, Left, 10 reps, Supine Heel Slides: AAROM, Strengthening, Left, 10 reps, Supine Goniometric ROM: L knee: 20 degrees from full extension, 70 degrees flexion, limited by pain    General Comments        Pertinent Vitals/Pain Pain Assessment Pain Assessment: 0-10 Pain Score: 10-Worst pain ever Pain Location: L knee Pain Descriptors / Indicators: Aching, Grimacing,  Guarding Pain Intervention(s): Premedicated before session, Monitored during session    Home Living                          Prior Function            PT Goals (current goals can now be found in the care plan section) Acute Rehab PT Goals Patient Stated Goal: to go home, decreased pain PT Goal Formulation: With patient Time For Goal Achievement: 07/18/22 Potential to Achieve Goals: Good Progress towards PT goals: Progressing toward goals    Frequency    BID      PT Plan Current plan remains appropriate    Co-evaluation              AM-PAC PT "6 Clicks" Mobility   Outcome Measure  Help needed turning from your back to your side while in a flat bed without using bedrails?: A Little Help needed moving from lying on your back to sitting on the side of a flat bed without using bedrails?: A Lot Help needed moving to and from a bed to a chair (including a wheelchair)?: A Lot Help needed standing up from a chair using your arms (e.g., wheelchair or bedside chair)?: A Lot Help needed to walk in Bean room?: Total Help needed climbing 3-5 steps with a railing? : Total 6 Click Score: 11    End of Session Equipment Utilized During Treatment: Gait belt Activity Tolerance: Patient limited by pain Patient left: in chair;with chair alarm set;with call bell/phone within reach;with SCD's reapplied (SCD on RLE, polar care on L knee, wound vac intact) Nurse Communication: Mobility status PT Visit Diagnosis: Other abnormalities of gait and mobility (R26.89);Pain;Muscle weakness (generalized) (M62.81) Pain - Right/Left: Left Pain - part of body: Knee     Time: 1110-1135 PT Time Calculation (min) (ACUTE ONLY): 25 min  Charges:  $Therapeutic Activity: 23-37 mins                     Dana Bean, PT, DPT 07/05/22, 12:08 PM   Dana Bean 07/05/2022, 12:05 PM

## 2022-07-06 LAB — CBC
HCT: 29.4 % — ABNORMAL LOW (ref 36.0–46.0)
Hemoglobin: 9.6 g/dL — ABNORMAL LOW (ref 12.0–15.0)
MCH: 31.3 pg (ref 26.0–34.0)
MCHC: 32.7 g/dL (ref 30.0–36.0)
MCV: 95.8 fL (ref 80.0–100.0)
Platelets: 221 10*3/uL (ref 150–400)
RBC: 3.07 MIL/uL — ABNORMAL LOW (ref 3.87–5.11)
RDW: 13.7 % (ref 11.5–15.5)
WBC: 10.4 10*3/uL (ref 4.0–10.5)
nRBC: 0 % (ref 0.0–0.2)

## 2022-07-06 NOTE — Progress Notes (Signed)
Physical Therapy Treatment Patient Details Name: Dana Bean MRN: [id number] DOB: 1956/09/22 Today's Date: 07/06/2022   History of Present Illness admitted for acute hospitalization s/p L TKA (07/04/22), WBAT.  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 Pt reports she anticipates d/c home today. Discussed DME with pt reporting she has a RW & w/c. Pt also has steps to enter home & PT educated pt on recommendation of non emergent EMS transport home & HHPT f/u. Pt agreeable to bed level exercises but requires heavy AAROM and very minimal L knee ROM noted during session; pt with c/o L knee pain even with ankle pump exercise. Pt with ted hose rolled down to mid calf with PT attempting to readjust it but pt declines despite PT educating her on need for no wrinkles to prevent circulation issues.  PA aware of PT recommendation of d/c to SNF but reports pt's only needed goal is to transfer; PA reports the plan is for pt to d/c home.    Recommendations for follow up therapy are one component of a multi-disciplinary discharge planning process, led by the attending physician.  Recommendations may be updated based on patient status, additional functional criteria and insurance authorization.  Follow Up Recommendations  Skilled nursing-short term rehab (<3 hours/day) Can patient physically be transported by private vehicle: No   Assistance Recommended at Discharge Frequent or constant Supervision/Assistance  Patient can return home with the following Two people to help with walking and/or transfers;Two people to help with bathing/dressing/bathroom;Assist for transportation;Help with stairs or ramp for entrance;Assistance with cooking/housework   Equipment Recommendations  Rolling walker (2 wheels);BSC/3in1    Recommendations for Other Services       Precautions / Restrictions  Precautions Precautions: Fall Restrictions Weight Bearing Restrictions: Yes LLE Weight Bearing: Weight bearing as tolerated     Mobility  Bed Mobility                    Transfers                        Ambulation/Gait                   Stairs             Wheelchair Mobility    Modified Rankin (Stroke Patients Only)       Balance                                            Cognition Arousal/Alertness: Awake/alert Behavior During Therapy: WFL for tasks assessed/performed Overall Cognitive Status: Within Functional Limits for tasks assessed                                          Exercises Total Joint Exercises Ankle Circles/Pumps: AROM, Left, 10 reps, Supine Quad Sets: AROM, Strengthening, Left, 10 reps, Supine Heel Slides: AAROM, Strengthening, Left, 10 reps, Supine Hip ABduction/ADduction: AAROM, Strengthening, Supine, Left, 10 reps    General Comments        Pertinent Vitals/Pain Pain Assessment Pain Assessment: Faces Faces Pain Scale: Hurts whole lot Pain Location: L knee  Pain Descriptors / Indicators: Aching, Grimacing, Guarding Pain Intervention(s): Monitored during session, Repositioned, Limited activity within patient's tolerance (polar care donned at end of session)    Home Living                          Prior Function            PT Goals (current goals can now be found in the care plan section) Acute Rehab PT Goals Patient Stated Goal: to go home, decreased pain PT Goal Formulation: With patient Time For Goal Achievement: 07/18/22 Potential to Achieve Goals: Fair Progress towards PT goals: PT to reassess next treatment    Frequency    BID      PT Plan Current plan remains appropriate    Co-evaluation              AM-PAC PT "6 Clicks" Mobility   Outcome Measure  Help needed turning from your back to your side while in a flat bed without  using bedrails?: A Little Help needed moving from lying on your back to sitting on the side of a flat bed without using bedrails?: A Lot Help needed moving to and from a bed to a chair (including a wheelchair)?: A Little Help needed standing up from a chair using your arms (e.g., wheelchair or bedside chair)?: A Lot Help needed to walk in hospital room?: Total Help needed climbing 3-5 steps with a railing? : Total 6 Click Score: 12    End of Session   Activity Tolerance: Patient limited by pain Patient left: in bed;with call bell/phone within reach;with bed alarm set (4 rais up per pt request)   PT Visit Diagnosis: Other abnormalities of gait and mobility (R26.89);Pain;Muscle weakness (generalized) (M62.81) Pain - Right/Left: Left Pain - part of body: Knee     Time: 5732-2025 PT Time Calculation (min) (ACUTE ONLY): 9 min  Charges:  $Therapeutic Exercise: 8-22 mins                     Lavone Nian, PT, DPT 07/06/22, 8:27 AM   Waunita Schooner 07/06/2022, 8:25 AM

## 2022-07-06 NOTE — TOC Transition Note (Signed)
Transition of Care [facility]) - CM/SW Discharge Note   Patient Details  Name: AGRIPINA GUYETTE MRN: [id number] Date of Birth: 1955/12/07  Transition of Care [facility]) CM/SW Contact:  Valente David, RN Phone Number: 07/06/2022, 9:33 AM   Clinical Narrative:     Patient will be discharged home today with home health.  Spoke with patient and husband Toula Moos, aware of discharge plan.  Cory with [place] notified of discharge today, will contact patient directly to schedule start of services.  DME recommended (rolling walker and 3in1), patient state she has both.  [facility] transport called, nurse aware.   Final next level of care: [place] Barriers to Discharge: Barriers Resolved   Patient Goals and CMS Choice Patient states their goals for this hospitalization and ongoing recovery are:: Home with home health CMS Medicare.gov Compare Post Acute Care list provided to:: Patient (Husband) Choice offered to / list presented to : Patient, Spouse  Discharge Placement                       Discharge Plan and Services                          HH Arranged: PT, OT [facility] Agency: [place] Date [place]: 07/06/22 Time [place]: [phone] Representative spoke with at [place]: [place] ([place]) Interventions     Readmission Risk Interventions     No data to display

## 2022-07-06 NOTE — Progress Notes (Signed)
   Subjective: 2 Days Post-Op Procedure(s) (LRB): TOTAL KNEE ARTHROPLASTY (Left) Patient reports pain as moderate and severe.   Patient is well, and has had no acute complaints or problems Denies any CP, SOB, ABD pain. We will continue therapy today.  Plan is to go Home after hospital stay.  Objective: Vital signs in last 24 hours: Temp:  [98 F (36.7 C)-98.6 F (37 C)] 98.6 F (37 C) (08/19 0518) Pulse Rate:  [73-91] 87 (08/19 0518) Resp:  [16-19] 17 (08/19 0518) BP: (82-136)/(34-59) 122/57 (08/19 0518) SpO2:  [95 %-100 %] 96 % (08/19 0518)  Intake/Output from previous day: 08/18 0701 - 08/19 0700 In: -  Out: 100 [Urine:100] Intake/Output this shift: No intake/output data recorded.  Recent Labs    07/04/22 1251 07/05/22 0432  HGB 11.3* 9.8*   Recent Labs    07/04/22 1251 07/05/22 0432  WBC 13.7* 9.8  RBC 3.68* 3.17*  HCT 35.0* 29.9*  PLT 269 267   Recent Labs    07/04/22 1251 07/05/22 0432  NA  --  138  K  --  3.8  CL  --  106  CO2  --  26  BUN  --  17  CREATININE 0.87 0.87  GLUCOSE  --  119*  CALCIUM  --  8.5*   No results for input(s): "LABPT", "INR" in the last 72 hours.  EXAM General - Patient is Alert, Appropriate, and Oriented Extremity - Sensation intact distally Intact pulses distally Dorsiflexion/Plantar flexion intact No cellulitis present Compartment soft Dressing - dressing C/D/I and no drainage, provena intact with out drainage Motor Function - intact, moving foot and toes well on exam.  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   Weight as of this encounter: 127.5 kg. Advance diet Up with therapy Acute postop blood loss anemia-hemoglobin 9.8.  Start iron supplement Vital signs are stable Recheck labs in the morning Care management to assist with discharge to home with home health PT today pending completion of PT goals.  Patient able to transfer to the chair.  Limited ambulation once at home.   DVT Prophylaxis - Lovenox, TED hose, and SCDs Weight-Bearing as tolerated to left leg   Reche Dixon, PA-C [place] 07/06/2022, 6:38 AM

## 2022-08-16 DIAGNOSIS — S82221K Displaced transverse fracture of shaft of right tibia, subsequent encounter for closed fracture with nonunion: Secondary | ICD-10-CM | POA: Diagnosis not present

## 2022-08-16 DIAGNOSIS — Z96652 Presence of left artificial knee joint: Secondary | ICD-10-CM | POA: Diagnosis not present

## 2022-08-28 DIAGNOSIS — M25471 Effusion, right ankle: Secondary | ICD-10-CM | POA: Diagnosis not present

## 2022-08-28 DIAGNOSIS — M5136 Other intervertebral disc degeneration, lumbar region: Secondary | ICD-10-CM | POA: Diagnosis not present

## 2022-08-28 DIAGNOSIS — M79672 Pain in left foot: Secondary | ICD-10-CM | POA: Diagnosis not present

## 2022-08-28 DIAGNOSIS — M79671 Pain in right foot: Secondary | ICD-10-CM | POA: Diagnosis not present

## 2022-08-28 DIAGNOSIS — M5416 Radiculopathy, lumbar region: Secondary | ICD-10-CM | POA: Diagnosis not present

## 2022-08-28 DIAGNOSIS — M25562 Pain in left knee: Secondary | ICD-10-CM | POA: Diagnosis not present

## 2022-08-28 DIAGNOSIS — I10 Essential (primary) hypertension: Secondary | ICD-10-CM | POA: Diagnosis not present

## 2022-08-28 DIAGNOSIS — Z79899 Other long term (current) drug therapy: Secondary | ICD-10-CM | POA: Diagnosis not present

## 2022-08-28 DIAGNOSIS — M25561 Pain in right knee: Secondary | ICD-10-CM | POA: Diagnosis not present

## 2022-08-28 DIAGNOSIS — Z6841 Body Mass Index (BMI) 40.0 and over, adult: Secondary | ICD-10-CM | POA: Diagnosis not present

## 2022-08-29 DIAGNOSIS — H47333 Pseudopapilledema of optic disc, bilateral: Secondary | ICD-10-CM | POA: Diagnosis not present

## 2022-08-29 DIAGNOSIS — H04123 Dry eye syndrome of bilateral lacrimal glands: Secondary | ICD-10-CM | POA: Diagnosis not present

## 2022-08-29 DIAGNOSIS — H524 Presbyopia: Secondary | ICD-10-CM | POA: Diagnosis not present

## 2022-08-29 DIAGNOSIS — H52203 Unspecified astigmatism, bilateral: Secondary | ICD-10-CM | POA: Diagnosis not present

## 2022-08-29 DIAGNOSIS — H5203 Hypermetropia, bilateral: Secondary | ICD-10-CM | POA: Diagnosis not present

## 2022-09-26 DIAGNOSIS — M79671 Pain in right foot: Secondary | ICD-10-CM | POA: Diagnosis not present

## 2022-09-26 DIAGNOSIS — M5136 Other intervertebral disc degeneration, lumbar region: Secondary | ICD-10-CM | POA: Diagnosis not present

## 2022-09-26 DIAGNOSIS — M25561 Pain in right knee: Secondary | ICD-10-CM | POA: Diagnosis not present

## 2022-09-26 DIAGNOSIS — I10 Essential (primary) hypertension: Secondary | ICD-10-CM | POA: Diagnosis not present

## 2022-09-26 DIAGNOSIS — G8929 Other chronic pain: Secondary | ICD-10-CM | POA: Diagnosis not present

## 2022-09-26 DIAGNOSIS — Z6841 Body Mass Index (BMI) 40.0 and over, adult: Secondary | ICD-10-CM | POA: Diagnosis not present

## 2022-09-26 DIAGNOSIS — M79672 Pain in left foot: Secondary | ICD-10-CM | POA: Diagnosis not present

## 2022-09-26 DIAGNOSIS — M25562 Pain in left knee: Secondary | ICD-10-CM | POA: Diagnosis not present

## 2022-09-26 DIAGNOSIS — M5416 Radiculopathy, lumbar region: Secondary | ICD-10-CM | POA: Diagnosis not present

## 2022-10-02 DIAGNOSIS — Z79899 Other long term (current) drug therapy: Secondary | ICD-10-CM | POA: Diagnosis not present

## 2022-10-08 DIAGNOSIS — Z6841 Body Mass Index (BMI) 40.0 and over, adult: Secondary | ICD-10-CM | POA: Diagnosis not present

## 2022-10-08 DIAGNOSIS — F329 Major depressive disorder, single episode, unspecified: Secondary | ICD-10-CM | POA: Diagnosis not present

## 2022-10-08 DIAGNOSIS — C419 Malignant neoplasm of bone and articular cartilage, unspecified: Secondary | ICD-10-CM | POA: Diagnosis not present

## 2022-10-08 DIAGNOSIS — E785 Hyperlipidemia, unspecified: Secondary | ICD-10-CM | POA: Diagnosis not present

## 2022-10-08 DIAGNOSIS — M170 Bilateral primary osteoarthritis of knee: Secondary | ICD-10-CM | POA: Diagnosis not present

## 2022-10-08 DIAGNOSIS — G990 Autonomic neuropathy in diseases classified elsewhere: Secondary | ICD-10-CM | POA: Diagnosis not present

## 2022-10-08 DIAGNOSIS — I10 Essential (primary) hypertension: Secondary | ICD-10-CM | POA: Diagnosis not present

## 2022-10-24 DIAGNOSIS — M25562 Pain in left knee: Secondary | ICD-10-CM | POA: Diagnosis not present

## 2022-10-24 DIAGNOSIS — M79672 Pain in left foot: Secondary | ICD-10-CM | POA: Diagnosis not present

## 2022-10-24 DIAGNOSIS — G8929 Other chronic pain: Secondary | ICD-10-CM | POA: Diagnosis not present

## 2022-10-24 DIAGNOSIS — I10 Essential (primary) hypertension: Secondary | ICD-10-CM | POA: Diagnosis not present

## 2022-10-24 DIAGNOSIS — Z6841 Body Mass Index (BMI) 40.0 and over, adult: Secondary | ICD-10-CM | POA: Diagnosis not present

## 2022-10-24 DIAGNOSIS — M25561 Pain in right knee: Secondary | ICD-10-CM | POA: Diagnosis not present

## 2022-10-24 DIAGNOSIS — M79671 Pain in right foot: Secondary | ICD-10-CM | POA: Diagnosis not present

## 2022-10-24 DIAGNOSIS — M5416 Radiculopathy, lumbar region: Secondary | ICD-10-CM | POA: Diagnosis not present

## 2022-10-24 DIAGNOSIS — M5136 Other intervertebral disc degeneration, lumbar region: Secondary | ICD-10-CM | POA: Diagnosis not present

## 2022-11-26 DIAGNOSIS — Z6841 Body Mass Index (BMI) 40.0 and over, adult: Secondary | ICD-10-CM | POA: Diagnosis not present

## 2022-11-26 DIAGNOSIS — G8929 Other chronic pain: Secondary | ICD-10-CM | POA: Diagnosis not present

## 2022-11-26 DIAGNOSIS — I10 Essential (primary) hypertension: Secondary | ICD-10-CM | POA: Diagnosis not present

## 2022-11-26 DIAGNOSIS — Z0000 Encounter for general adult medical examination without abnormal findings: Secondary | ICD-10-CM | POA: Diagnosis not present

## 2022-11-26 DIAGNOSIS — M79672 Pain in left foot: Secondary | ICD-10-CM | POA: Diagnosis not present

## 2022-11-26 DIAGNOSIS — M25561 Pain in right knee: Secondary | ICD-10-CM | POA: Diagnosis not present

## 2022-11-26 DIAGNOSIS — M5416 Radiculopathy, lumbar region: Secondary | ICD-10-CM | POA: Diagnosis not present

## 2022-11-26 DIAGNOSIS — M79671 Pain in right foot: Secondary | ICD-10-CM | POA: Diagnosis not present

## 2022-11-26 DIAGNOSIS — M5136 Other intervertebral disc degeneration, lumbar region: Secondary | ICD-10-CM | POA: Diagnosis not present

## 2022-11-27 DIAGNOSIS — H52209 Unspecified astigmatism, unspecified eye: Secondary | ICD-10-CM | POA: Diagnosis not present

## 2022-11-27 DIAGNOSIS — H524 Presbyopia: Secondary | ICD-10-CM | POA: Diagnosis not present

## 2022-11-27 DIAGNOSIS — H5203 Hypermetropia, bilateral: Secondary | ICD-10-CM | POA: Diagnosis not present

## 2022-12-25 DIAGNOSIS — S82221K Displaced transverse fracture of shaft of right tibia, subsequent encounter for closed fracture with nonunion: Secondary | ICD-10-CM | POA: Diagnosis not present

## 2022-12-25 DIAGNOSIS — S82401K Unspecified fracture of shaft of right fibula, subsequent encounter for closed fracture with nonunion: Secondary | ICD-10-CM | POA: Diagnosis not present

## 2022-12-25 DIAGNOSIS — Z96652 Presence of left artificial knee joint: Secondary | ICD-10-CM | POA: Diagnosis not present

## 2022-12-25 DIAGNOSIS — S82201K Unspecified fracture of shaft of right tibia, subsequent encounter for closed fracture with nonunion: Secondary | ICD-10-CM | POA: Diagnosis not present

## 2022-12-26 ENCOUNTER — Other Ambulatory Visit: Payer: Self-pay | Admitting: Orthopedic Surgery

## 2022-12-26 DIAGNOSIS — M5136 Other intervertebral disc degeneration, lumbar region: Secondary | ICD-10-CM | POA: Diagnosis not present

## 2022-12-26 DIAGNOSIS — S82221K Displaced transverse fracture of shaft of right tibia, subsequent encounter for closed fracture with nonunion: Secondary | ICD-10-CM

## 2022-12-26 DIAGNOSIS — M5416 Radiculopathy, lumbar region: Secondary | ICD-10-CM | POA: Diagnosis not present

## 2022-12-26 DIAGNOSIS — G8929 Other chronic pain: Secondary | ICD-10-CM | POA: Diagnosis not present

## 2022-12-26 DIAGNOSIS — S82201K Unspecified fracture of shaft of right tibia, subsequent encounter for closed fracture with nonunion: Secondary | ICD-10-CM

## 2022-12-26 DIAGNOSIS — I10 Essential (primary) hypertension: Secondary | ICD-10-CM | POA: Diagnosis not present

## 2022-12-26 DIAGNOSIS — M79671 Pain in right foot: Secondary | ICD-10-CM | POA: Diagnosis not present

## 2022-12-26 DIAGNOSIS — M25561 Pain in right knee: Secondary | ICD-10-CM | POA: Diagnosis not present

## 2022-12-26 DIAGNOSIS — Z6841 Body Mass Index (BMI) 40.0 and over, adult: Secondary | ICD-10-CM | POA: Diagnosis not present

## 2022-12-26 DIAGNOSIS — M25562 Pain in left knee: Secondary | ICD-10-CM | POA: Diagnosis not present

## 2022-12-26 DIAGNOSIS — M79672 Pain in left foot: Secondary | ICD-10-CM | POA: Diagnosis not present

## 2023-01-01 ENCOUNTER — Ambulatory Visit
Admission: RE | Admit: 2023-01-01 | Discharge: 2023-01-01 | Disposition: A | Discharge status: Home / Self Care | Payer: Medicare HMO | Source: Ambulatory Visit | Attending: Orthopedic Surgery | Admitting: Orthopedic Surgery

## 2023-01-01 DIAGNOSIS — S82221K Displaced transverse fracture of shaft of right tibia, subsequent encounter for closed fracture with nonunion: Secondary | ICD-10-CM | POA: Diagnosis not present

## 2023-01-01 DIAGNOSIS — S82201K Unspecified fracture of shaft of right tibia, subsequent encounter for closed fracture with nonunion: Secondary | ICD-10-CM | POA: Diagnosis not present

## 2023-01-01 DIAGNOSIS — R600 Localized edema: Secondary | ICD-10-CM | POA: Diagnosis not present

## 2023-01-15 DIAGNOSIS — S82201K Unspecified fracture of shaft of right tibia, subsequent encounter for closed fracture with nonunion: Secondary | ICD-10-CM | POA: Diagnosis not present

## 2023-01-24 DIAGNOSIS — M25562 Pain in left knee: Secondary | ICD-10-CM | POA: Diagnosis not present

## 2023-01-24 DIAGNOSIS — M5416 Radiculopathy, lumbar region: Secondary | ICD-10-CM | POA: Diagnosis not present

## 2023-01-24 DIAGNOSIS — M5136 Other intervertebral disc degeneration, lumbar region: Secondary | ICD-10-CM | POA: Diagnosis not present

## 2023-01-24 DIAGNOSIS — I10 Essential (primary) hypertension: Secondary | ICD-10-CM | POA: Diagnosis not present

## 2023-01-24 DIAGNOSIS — M25561 Pain in right knee: Secondary | ICD-10-CM | POA: Diagnosis not present

## 2023-01-24 DIAGNOSIS — M79672 Pain in left foot: Secondary | ICD-10-CM | POA: Diagnosis not present

## 2023-01-24 DIAGNOSIS — Z6841 Body Mass Index (BMI) 40.0 and over, adult: Secondary | ICD-10-CM | POA: Diagnosis not present

## 2023-01-24 DIAGNOSIS — G8929 Other chronic pain: Secondary | ICD-10-CM | POA: Diagnosis not present

## 2023-01-24 DIAGNOSIS — M79671 Pain in right foot: Secondary | ICD-10-CM | POA: Diagnosis not present

## 2023-02-20 DIAGNOSIS — M5136 Other intervertebral disc degeneration, lumbar region: Secondary | ICD-10-CM | POA: Diagnosis not present

## 2023-02-20 DIAGNOSIS — M79671 Pain in right foot: Secondary | ICD-10-CM | POA: Diagnosis not present

## 2023-02-20 DIAGNOSIS — I10 Essential (primary) hypertension: Secondary | ICD-10-CM | POA: Diagnosis not present

## 2023-02-20 DIAGNOSIS — M25561 Pain in right knee: Secondary | ICD-10-CM | POA: Diagnosis not present

## 2023-02-20 DIAGNOSIS — M79672 Pain in left foot: Secondary | ICD-10-CM | POA: Diagnosis not present

## 2023-02-20 DIAGNOSIS — M5416 Radiculopathy, lumbar region: Secondary | ICD-10-CM | POA: Diagnosis not present

## 2023-02-20 DIAGNOSIS — M25562 Pain in left knee: Secondary | ICD-10-CM | POA: Diagnosis not present

## 2023-02-20 DIAGNOSIS — Z6841 Body Mass Index (BMI) 40.0 and over, adult: Secondary | ICD-10-CM | POA: Diagnosis not present

## 2023-02-20 DIAGNOSIS — G8929 Other chronic pain: Secondary | ICD-10-CM | POA: Diagnosis not present

## 2023-02-28 DIAGNOSIS — M1711 Unilateral primary osteoarthritis, right knee: Secondary | ICD-10-CM | POA: Diagnosis not present

## 2023-02-28 DIAGNOSIS — E119 Type 2 diabetes mellitus without complications: Secondary | ICD-10-CM | POA: Diagnosis not present

## 2023-02-28 DIAGNOSIS — Z6841 Body Mass Index (BMI) 40.0 and over, adult: Secondary | ICD-10-CM | POA: Diagnosis not present

## 2023-02-28 DIAGNOSIS — S89091A Other physeal fracture of upper end of right tibia, initial encounter for closed fracture: Secondary | ICD-10-CM | POA: Diagnosis not present

## 2023-03-20 DIAGNOSIS — M25561 Pain in right knee: Secondary | ICD-10-CM | POA: Diagnosis not present

## 2023-03-20 DIAGNOSIS — M5136 Other intervertebral disc degeneration, lumbar region: Secondary | ICD-10-CM | POA: Diagnosis not present

## 2023-03-20 DIAGNOSIS — M25562 Pain in left knee: Secondary | ICD-10-CM | POA: Diagnosis not present

## 2023-03-20 DIAGNOSIS — I10 Essential (primary) hypertension: Secondary | ICD-10-CM | POA: Diagnosis not present

## 2023-03-20 DIAGNOSIS — G8929 Other chronic pain: Secondary | ICD-10-CM | POA: Diagnosis not present

## 2023-03-20 DIAGNOSIS — M5416 Radiculopathy, lumbar region: Secondary | ICD-10-CM | POA: Diagnosis not present

## 2023-03-24 DIAGNOSIS — F1120 Opioid dependence, uncomplicated: Secondary | ICD-10-CM | POA: Diagnosis not present

## 2023-04-15 DIAGNOSIS — R92323 Mammographic fibroglandular density, bilateral breasts: Secondary | ICD-10-CM | POA: Diagnosis not present

## 2023-04-15 DIAGNOSIS — Z1231 Encounter for screening mammogram for malignant neoplasm of breast: Secondary | ICD-10-CM | POA: Diagnosis not present

## 2023-04-17 DIAGNOSIS — Z9181 History of falling: Secondary | ICD-10-CM | POA: Diagnosis not present

## 2023-04-17 DIAGNOSIS — E785 Hyperlipidemia, unspecified: Secondary | ICD-10-CM | POA: Diagnosis not present

## 2023-04-17 DIAGNOSIS — Z6841 Body Mass Index (BMI) 40.0 and over, adult: Secondary | ICD-10-CM | POA: Diagnosis not present

## 2023-04-17 DIAGNOSIS — D509 Iron deficiency anemia, unspecified: Secondary | ICD-10-CM | POA: Diagnosis not present

## 2023-04-17 DIAGNOSIS — G990 Autonomic neuropathy in diseases classified elsewhere: Secondary | ICD-10-CM | POA: Diagnosis not present

## 2023-04-17 DIAGNOSIS — M170 Bilateral primary osteoarthritis of knee: Secondary | ICD-10-CM | POA: Diagnosis not present

## 2023-04-17 DIAGNOSIS — I10 Essential (primary) hypertension: Secondary | ICD-10-CM | POA: Diagnosis not present

## 2023-04-21 DIAGNOSIS — M170 Bilateral primary osteoarthritis of knee: Secondary | ICD-10-CM | POA: Diagnosis not present

## 2023-04-21 DIAGNOSIS — M818 Other osteoporosis without current pathological fracture: Secondary | ICD-10-CM | POA: Diagnosis not present

## 2023-04-21 DIAGNOSIS — I10 Essential (primary) hypertension: Secondary | ICD-10-CM | POA: Diagnosis not present

## 2023-04-21 DIAGNOSIS — F1120 Opioid dependence, uncomplicated: Secondary | ICD-10-CM | POA: Diagnosis not present

## 2023-04-23 DIAGNOSIS — Z79899 Other long term (current) drug therapy: Secondary | ICD-10-CM | POA: Diagnosis not present

## 2023-04-28 DIAGNOSIS — M810 Age-related osteoporosis without current pathological fracture: Secondary | ICD-10-CM | POA: Diagnosis not present

## 2023-04-28 DIAGNOSIS — E2839 Other primary ovarian failure: Secondary | ICD-10-CM | POA: Diagnosis not present

## 2023-05-14 DIAGNOSIS — X58XXXD Exposure to other specified factors, subsequent encounter: Secondary | ICD-10-CM | POA: Diagnosis not present

## 2023-05-14 DIAGNOSIS — M1710 Unilateral primary osteoarthritis, unspecified knee: Secondary | ICD-10-CM | POA: Diagnosis not present

## 2023-05-14 DIAGNOSIS — Z6841 Body Mass Index (BMI) 40.0 and over, adult: Secondary | ICD-10-CM | POA: Diagnosis not present

## 2023-05-14 DIAGNOSIS — Z96652 Presence of left artificial knee joint: Secondary | ICD-10-CM | POA: Diagnosis not present

## 2023-05-14 DIAGNOSIS — M21161 Varus deformity, not elsewhere classified, right knee: Secondary | ICD-10-CM | POA: Diagnosis not present

## 2023-05-14 DIAGNOSIS — M19071 Primary osteoarthritis, right ankle and foot: Secondary | ICD-10-CM | POA: Diagnosis not present

## 2023-05-14 DIAGNOSIS — M1711 Unilateral primary osteoarthritis, right knee: Secondary | ICD-10-CM | POA: Diagnosis not present

## 2023-05-14 DIAGNOSIS — M2170 Unequal limb length (acquired), unspecified site: Secondary | ICD-10-CM | POA: Diagnosis not present

## 2023-05-14 DIAGNOSIS — D492 Neoplasm of unspecified behavior of bone, soft tissue, and skin: Secondary | ICD-10-CM | POA: Diagnosis not present

## 2023-05-14 DIAGNOSIS — M25561 Pain in right knee: Secondary | ICD-10-CM | POA: Diagnosis not present

## 2023-05-14 DIAGNOSIS — S82231K Displaced oblique fracture of shaft of right tibia, subsequent encounter for closed fracture with nonunion: Secondary | ICD-10-CM | POA: Diagnosis not present
